# Patient Record
Sex: Female | Born: 1990 | Race: Black or African American | Hispanic: No | Marital: Single | Smoking: Former smoker
Health system: Southern US, Community
[De-identification: ages and names within clinical notes are randomized; demographics above are authoritative.]

## PROBLEM LIST (undated history)

## (undated) DIAGNOSIS — R51 Headache: Secondary | ICD-10-CM

## (undated) DIAGNOSIS — R519 Headache, unspecified: Secondary | ICD-10-CM

## (undated) HISTORY — PX: DILATION AND CURETTAGE OF UTERUS: SHX78

## (undated) HISTORY — PX: INDUCED ABORTION: SHX677

---
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
## 2011-02-27 LAB — POCT PREGNANCY, URINE
Operator id: 116391
Preg Test, Ur: NEGATIVE

## 2011-02-27 LAB — WET PREP, GENITAL
Trich, Wet Prep: NONE SEEN
Yeast Wet Prep HPF POC: NONE SEEN

## 2011-02-27 LAB — GC/CHLAMYDIA PROBE AMP, GENITAL
Chlamydia, DNA Probe: NEGATIVE
GC Probe Amp, Genital: NEGATIVE

## 2011-10-02 ENCOUNTER — Emergency Department (HOSPITAL_COMMUNITY)
Admission: EM | Admit: 2011-10-02 | Discharge: 2011-10-02 | Disposition: A | Discharge status: Home / Self Care | Payer: Self-pay | Attending: Emergency Medicine | Admitting: Emergency Medicine

## 2011-10-02 ENCOUNTER — Encounter (HOSPITAL_COMMUNITY): Payer: Self-pay

## 2011-10-02 DIAGNOSIS — B9689 Other specified bacterial agents as the cause of diseases classified elsewhere: Secondary | ICD-10-CM

## 2011-10-02 DIAGNOSIS — J45909 Unspecified asthma, uncomplicated: Secondary | ICD-10-CM | POA: Insufficient documentation

## 2011-10-02 DIAGNOSIS — N760 Acute vaginitis: Secondary | ICD-10-CM | POA: Insufficient documentation

## 2011-10-02 DIAGNOSIS — A599 Trichomoniasis, unspecified: Secondary | ICD-10-CM

## 2011-10-02 DIAGNOSIS — R109 Unspecified abdominal pain: Secondary | ICD-10-CM | POA: Insufficient documentation

## 2011-10-02 DIAGNOSIS — A499 Bacterial infection, unspecified: Secondary | ICD-10-CM | POA: Insufficient documentation

## 2011-10-02 DIAGNOSIS — R3915 Urgency of urination: Secondary | ICD-10-CM | POA: Insufficient documentation

## 2011-10-02 DIAGNOSIS — N949 Unspecified condition associated with female genital organs and menstrual cycle: Secondary | ICD-10-CM | POA: Insufficient documentation

## 2011-10-02 DIAGNOSIS — R300 Dysuria: Secondary | ICD-10-CM | POA: Insufficient documentation

## 2011-10-02 DIAGNOSIS — R350 Frequency of micturition: Secondary | ICD-10-CM | POA: Insufficient documentation

## 2011-10-02 LAB — URINALYSIS, ROUTINE W REFLEX MICROSCOPIC
Bilirubin Urine: NEGATIVE
Ketones, ur: NEGATIVE mg/dL
Nitrite: NEGATIVE
Specific Gravity, Urine: 1.025 (ref 1.005–1.030)
Urobilinogen, UA: 0.2 mg/dL (ref 0.0–1.0)
pH: 5.5 (ref 5.0–8.0)

## 2011-10-02 LAB — WET PREP, GENITAL: Yeast Wet Prep HPF POC: NONE SEEN

## 2011-10-02 LAB — URINE MICROSCOPIC-ADD ON

## 2011-10-02 MED ORDER — METRONIDAZOLE 500 MG PO TABS: 500.0000 mg | ORAL_TABLET | Freq: Two times a day (BID) | ORAL | Status: AC

## 2011-10-02 NOTE — ED Provider Notes (Signed)
Medical screening examination/treatment/procedure(s) were performed by non-physician practitioner and as supervising physician I was immediately available for consultation/collaboration.  Loren Racer, MD 10/02/11 [phone]

## 2011-10-02 NOTE — ED Provider Notes (Signed)
History     CSN: [id number]  Arrival date & time 10/02/11  4098   First MD Initiated Contact with Patient 10/02/11 [phone]      Chief Complaint  Patient presents with  . Urinary Tract Infection    (Consider location/radiation/quality/duration/timing/severity/associated sxs/prior treatment) Patient is a 21 y.o. female presenting with dysuria. The history is provided by the patient.  Dysuria  This is a new problem. Episode onset: today. The problem occurs every urination. The problem has been gradually worsening. The quality of the pain is described as burning. The pain is moderate. There has been no fever. She is sexually active. Associated symptoms include frequency and urgency. Pertinent negatives include no chills, no nausea, no vomiting, no discharge, no hematuria and no flank pain. Possible pregnancy: LMP 1 month ago, unsure. She has tried nothing for the symptoms.    Past Medical History  Diagnosis Date  . Asthma     History reviewed. No pertinent past surgical history.  No family history on file.  History  Substance Use Topics  . Smoking status: Current Everyday Smoker  . Smokeless tobacco: Not on file  . Alcohol Use: Yes     Review of Systems  Constitutional: Negative for fever and chills.  Gastrointestinal: Negative for nausea and vomiting. Abdominal pain: lower.  Genitourinary: Positive for dysuria, urgency and frequency. Negative for hematuria, flank pain, vaginal discharge and pelvic pain.  Musculoskeletal: Negative for back pain.    Allergies  Review of patient's allergies indicates no known allergies.  Home Medications   Current Outpatient Rx  Name Route Sig Dispense Refill  . IBUPROFEN 200 MG PO TABS Oral Take 400 mg by mouth every 6 (six) hours as needed. For pain      BP 115/82  Pulse 78  Temp 98.4 F (36.9 C)  SpO2 100%  LMP 09/03/2011  Physical Exam  Nursing note reviewed. Constitutional: She appears well-developed and well-nourished. No  distress.       Vital signs are reviewed and are normal.   HENT:  Head: Normocephalic and atraumatic.  Eyes: Conjunctivae are normal.  Neck: Neck supple.  Cardiovascular: Normal rate and regular rhythm.   Pulmonary/Chest: Effort normal. No respiratory distress.  Abdominal: Soft. Bowel sounds are normal. She exhibits no distension. There is Tenderness: mild, suprapubic.Marland Kitchen  Genitourinary: There is tenderness on the right labia. There is no rash or lesion on the right labia. There is tenderness on the left labia. There is no rash or lesion on the left labia. Uterus is not tender. Cervix exhibits no motion tenderness and no friability. Right adnexum displays no mass, no tenderness and no fullness. Left adnexum displays no mass, no tenderness and no fullness. No tenderness or bleeding around the vagina. No foreign body around the vagina. No signs of injury around the vagina. Vaginal discharge found.  Musculoskeletal: She exhibits no edema.  Neurological: She is alert.       MS appropriate for pt and situation  Skin: Skin is warm and dry.  Psychiatric: She has a normal mood and affect.    ED Course  Procedures (including critical care time)  Labs Reviewed  URINALYSIS, ROUTINE W REFLEX MICROSCOPIC - Abnormal; Notable for the following:    Leukocytes, UA SMALL (*)    All other components within normal limits  WET PREP, GENITAL - Abnormal; Notable for the following:    Trich, Wet Prep FEW (*)    Clue Cells Wet Prep HPF POC FEW (*)    WBC, Wet  Prep HPF POC FEW (*)    All other components within normal limits  PREGNANCY, URINE  URINE MICROSCOPIC-ADD ON  GC/CHLAMYDIA PROBE AMP, GENITAL   No results found.   Dx 1: Trichomonas Dx 2: Bacterial vaginosis   MDM  Dysuria x 1 day. U/a without clear evidence of UTI. Will culture. Pelvic exam with labial tenderness, no lesions. Wet prep + for trichomonas, b.v. Will tx with flagyl. Results and plan discussed with pt. Safe sex practices  discussed.        Shaaron Adler, PA-C 10/02/11 1204

## 2011-10-02 NOTE — Discharge Instructions (Signed)
Call the flow manager at the phone number above if you do not hear from the hospital in 3 days.     Free STD Testing These locations offer FREE, confidential testing for HIV, chlamydia, gonorrhea, and syphilis: [facility]: [address] [place]    [phone]    Mondays from 5pm-7pm [facility]: [address] [place], [address]    Self Help Building    [phone]    Wednesdays from 2pm-8pm [facility]: [address]    [phone]    Thursdays from 9am-12pm and 1pm-4pm [facility]: [address]    [phone]    Tuesdays from 9am-12pm, Thursdays from 1pm-4pm   [facility] offers FREE, confidential testing and treatment for HIV, chlamydia, gonorrhea, syphilis, herpes, bacterial vaginosis, yeast, and trichomoniasis:  Call [phone] for an appointment at either location, testing is Monday through Friday at both locations  [facility]: [address] [place] [facility]: [address]          Trichomoniasis Trichomoniasis is an infection, caused by the Trichomonas organism, that affects both women and men. In women, the outer female genitalia and the vagina are affected. In men, the penis is mainly affected, but the prostate and other reproductive organs can also be involved. Trichomoniasis is a sexually transmitted disease (STD) and is most often passed to another person through sexual contact. The majority of people who get trichomoniasis do so from a sexual encounter and are also at risk for other STDs. CAUSES   Sexual intercourse with an infected partner.   It can be present in swimming pools or hot tubs.  SYMPTOMS   Abnormal gray-green frothy vaginal discharge in women.   Vaginal itching and irritation in women.   Itching and irritation of the area outside the vagina in women.   Penile discharge with or without pain in males.   Inflammation of the  urethra (urethritis), causing painful urination.   Bleeding after sexual intercourse.  RELATED COMPLICATIONS  Pelvic inflammatory disease.   Infection of the uterus (endometritis).   Infertility.   Tubal (ectopic) pregnancy.   It can be associated with other STDs, including gonorrhea and chlamydia, hepatitis B, and HIV.  COMPLICATIONS DURING PREGNANCY  Early (premature) delivery.   Premature rupture of the membranes (PROM).   Low birth weight.  DIAGNOSIS   Visualization of Trichomonas under the microscope from the vagina discharge.   Ph of the vagina greater than 4.5, tested with a test tape.   Trich Rapid Test.   Culture of the organism, but this is not usually needed.   It may be found on a Pap test.   Having a "strawberry cervix,"which means the cervix looks very red like a strawberry.  TREATMENT   You may be given medication to fight the infection. Inform your caregiver if you could be or are pregnant. Some medications used to treat the infection should not be taken during pregnancy.   Over-the-counter medications or creams to decrease itching or irritation may be recommended.   Your sexual partner will need to be treated if infected.  HOME CARE INSTRUCTIONS   Take all medication prescribed by your caregiver.   Take over-the-counter medication for itching or irritation as directed by your caregiver.   Do not have sexual intercourse while you have the infection.   Do not douche or wear tampons.   Discuss your infection with  your partner, as your partner may have acquired the infection from you. Or, your partner may have been the person who transmitted the infection to you.   Have your sex partner examined and treated if necessary.   Practice safe, informed, and protected sex.   See your caregiver for other STD testing.  SEEK MEDICAL CARE IF:   You still have symptoms after you finish the medication.   You have an oral temperature above 102 F (38.9  C).   You develop belly (abdominal) pain.   You have pain when you urinate.   You have bleeding after sexual intercourse.   You develop a rash.   The medication makes you sick or makes you throw up (vomit).  Document Released: 11/01/2000 Document Revised: 04/27/2011 Document Reviewed: 11/27/2008 [facility] Patient Information 2012 [place], [place].         Bacterial Vaginosis Bacterial vaginosis (BV) is a vaginal infection where the normal balance of bacteria in the vagina is disrupted. The normal balance is then replaced by an overgrowth of certain bacteria. There are several different kinds of bacteria that can cause BV. BV is the most common vaginal infection in women of childbearing age. CAUSES   The cause of BV is not fully understood. BV develops when there is an increase or imbalance of harmful bacteria.   Some activities or behaviors can upset the normal balance of bacteria in the vagina and put women at increased risk including:   Having a new sex partner or multiple sex partners.   Douching.   Using an intrauterine device (IUD) for contraception.   It is not clear what role sexual activity plays in the development of BV. However, women that have never had sexual intercourse are rarely infected with BV.  Women do not get BV from toilet seats, bedding, swimming pools or from touching objects around them.  SYMPTOMS   Grey vaginal discharge.   A fish-like odor with discharge, especially after sexual intercourse.   Itching or burning of the vagina and vulva.   Burning or pain with urination.   Some women have no signs or symptoms at all.  DIAGNOSIS  Your caregiver must examine the vagina for signs of BV. Your caregiver will perform lab tests and look at the sample of vaginal fluid through a microscope. They will look for bacteria and abnormal cells (clue cells), a pH test higher than 4.5, and a positive amine test all associated with BV.  RISKS AND COMPLICATIONS     Pelvic inflammatory disease (PID).   Infections following gynecology surgery.   Developing HIV.   Developing herpes virus.  TREATMENT  Sometimes BV will clear up without treatment. However, all women with symptoms of BV should be treated to avoid complications, especially if gynecology surgery is planned. Female partners generally do not need to be treated. However, BV may spread between female sex partners so treatment is helpful in preventing a recurrence of BV.   BV may be treated with antibiotics. The antibiotics come in either pill or vaginal cream forms. Either can be used with nonpregnant or pregnant women, but the recommended dosages differ. These antibiotics are not harmful to the baby.   BV can recur after treatment. If this happens, a second round of antibiotics will often be prescribed.   Treatment is important for pregnant women. If not treated, BV can cause a premature delivery, especially for a pregnant woman who had a premature birth in the past. All pregnant women who have symptoms of  BV should be checked and treated.   For chronic reoccurrence of BV, treatment with a type of prescribed gel vaginally twice a week is helpful.  HOME CARE INSTRUCTIONS   Finish all medication as directed by your caregiver.   Do not have sex until treatment is completed.   Tell your sexual partner that you have a vaginal infection. They should see their caregiver and be treated if they have problems, such as a mild rash or itching.   Practice safe sex. Use condoms. Only have 1 sex partner.  PREVENTION  Basic prevention steps can help reduce the risk of upsetting the natural balance of bacteria in the vagina and developing BV:  Do not have sexual intercourse (be abstinent).   Do not douche.   Use all of the medicine prescribed for treatment of BV, even if the signs and symptoms go away.   Tell your sex partner if you have BV. That way, they can be treated, if needed, to prevent  reoccurrence.  SEEK MEDICAL CARE IF:   Your symptoms are not improving after 3 days of treatment.   You have increased discharge, pain, or fever.  MAKE SURE YOU:   Understand these instructions.   Will watch your condition.   Will get help right away if you are not doing well or get worse.  FOR MORE INFORMATION  Division of STD Prevention (DSTDP), Centers for Disease Control and Prevention: [employer] American Social Health Association (ASHA): www.ashastd.org  Document Released: 05/08/2005 Document Revised: 04/27/2011 Document Reviewed: 10/29/2008 [facility] Patient Information 2012 [place], [place].

## 2011-10-02 NOTE — ED Notes (Signed)
Pt woke this morning with symptoms of burning/painful urination.

## 2011-10-03 LAB — GC/CHLAMYDIA PROBE AMP, GENITAL
Chlamydia, DNA Probe: NEGATIVE
GC Probe Amp, Genital: NEGATIVE

## 2011-10-03 LAB — URINE CULTURE

## 2012-12-18 ENCOUNTER — Encounter (HOSPITAL_COMMUNITY): Payer: Self-pay | Admitting: Physical Medicine and Rehabilitation

## 2012-12-18 ENCOUNTER — Emergency Department (HOSPITAL_COMMUNITY)
Admission: EM | Admit: 2012-12-18 | Discharge: 2012-12-18 | Disposition: A | Discharge status: Home / Self Care | Payer: Self-pay | Attending: Emergency Medicine | Admitting: Emergency Medicine

## 2012-12-18 DIAGNOSIS — J029 Acute pharyngitis, unspecified: Secondary | ICD-10-CM | POA: Insufficient documentation

## 2012-12-18 DIAGNOSIS — IMO0001 Reserved for inherently not codable concepts without codable children: Secondary | ICD-10-CM | POA: Insufficient documentation

## 2012-12-18 DIAGNOSIS — J45909 Unspecified asthma, uncomplicated: Secondary | ICD-10-CM | POA: Insufficient documentation

## 2012-12-18 DIAGNOSIS — R062 Wheezing: Secondary | ICD-10-CM | POA: Insufficient documentation

## 2012-12-18 DIAGNOSIS — R51 Headache: Secondary | ICD-10-CM | POA: Insufficient documentation

## 2012-12-18 DIAGNOSIS — F17200 Nicotine dependence, unspecified, uncomplicated: Secondary | ICD-10-CM | POA: Insufficient documentation

## 2012-12-18 MED ORDER — IBUPROFEN 800 MG PO TABS: 800.0000 mg | ORAL_TABLET | Freq: Three times a day (TID) | ORAL | Status: DC

## 2012-12-18 MED ORDER — HYDROCODONE-ACETAMINOPHEN 5-325 MG PO TABS: 2.0000 | ORAL_TABLET | ORAL | Status: DC | PRN

## 2012-12-18 MED ORDER — CEPHALEXIN 500 MG PO CAPS: 500.0000 mg | ORAL_CAPSULE | Freq: Four times a day (QID) | ORAL | Status: DC

## 2012-12-18 NOTE — ED Provider Notes (Signed)
CSN: [id number]     Arrival date & time 12/18/12  6578 History     First MD Initiated Contact with Patient 12/18/12 [phone]     Chief Complaint  Patient presents with  . Generalized Body Aches  . Sore Throat   (Consider location/radiation/quality/duration/timing/severity/associated sxs/prior Treatment) Patient is a 22 y.o. female presenting with pharyngitis. The history is provided by the patient.  Sore Throat The current episode started yesterday. The problem occurs constantly. The problem has been gradually worsening. Associated symptoms include headaches, myalgias and a sore throat. Pertinent negatives include no abdominal pain, arthralgias, chest pain, congestion, coughing, fever, neck pain or vomiting. The symptoms are aggravated by swallowing. She has tried NSAIDs for the symptoms. The treatment provided no relief.    Past Medical History  Diagnosis Date  . Asthma    No past surgical history on file. No family history on file. History  Substance Use Topics  . Smoking status: Current Every Day Smoker    Types: Cigarettes  . Smokeless tobacco: Not on file  . Alcohol Use: Yes   OB History   Grav Para Term Preterm Abortions TAB SAB Ect Mult Living                 Review of Systems  Constitutional: Negative for fever and activity change.       All ROS Neg except as noted in HPI  HENT: Positive for sore throat. Negative for nosebleeds, congestion and neck pain.   Eyes: Negative for photophobia and discharge.  Respiratory: Positive for wheezing. Negative for cough and shortness of breath.   Cardiovascular: Negative for chest pain and palpitations.  Gastrointestinal: Negative for vomiting, abdominal pain and blood in stool.  Genitourinary: Negative for dysuria, frequency and hematuria.  Musculoskeletal: Positive for myalgias. Negative for back pain and arthralgias.  Skin: Negative.   Neurological: Positive for headaches. Negative for dizziness, seizures and speech difficulty.   Psychiatric/Behavioral: Negative for hallucinations and confusion.    Allergies  Review of patient's allergies indicates no known allergies.  Home Medications   Current Outpatient Rx  Name  Route  Sig  Dispense  Refill  . ibuprofen (ADVIL,MOTRIN) 200 MG tablet   Oral   Take 400 mg by mouth every 6 (six) hours as needed. For pain          BP 107/74  Pulse 108  Temp(Src) 99 F (37.2 C) (Oral)  Resp 18  SpO2 100% Physical Exam  Nursing note and vitals reviewed. Constitutional: She is oriented to person, place, and time. She appears well-developed and well-nourished.  Non-toxic appearance.  HENT:  Head: Normocephalic.  Right Ear: Tympanic membrane and external ear normal.  Left Ear: Tympanic membrane and external ear normal.  Mouth/Throat: Uvula is midline. Posterior oropharyngeal erythema present.  Eyes: EOM and lids are normal. Pupils are equal, round, and reactive to light.  Neck: Trachea normal and normal range of motion. Neck supple. Carotid bruit is not present.  Few palpable lymph nodes present.  Cardiovascular: Normal rate, regular rhythm, normal heart sounds, intact distal pulses and normal pulses.   Pulmonary/Chest: Breath sounds normal. No respiratory distress.  Abdominal: Soft. Bowel sounds are normal. There is no tenderness. There is no guarding.  Musculoskeletal: Normal range of motion.  Lymphadenopathy:       Head (right side): No submandibular adenopathy present.       Head (left side): No submandibular adenopathy present.    She has no cervical adenopathy.  Neurological: She is alert  and oriented to person, place, and time. She has normal strength. No cranial nerve deficit or sensory deficit.  Skin: Skin is warm and dry.  Psychiatric: She has a normal mood and affect. Her speech is normal.    ED Course   Procedures (including critical care time)  Labs Reviewed - No data to display No results found. No diagnosis found.  MDM   Pt presents with one  day of body aches, sore throat and geneerally not feeling well. Temp 99. Pulse elevated at 108. Pulse ox 100% on room air. WNL by my interpretation.Pt speaks in complete sentences. No distress. Plan: Rx for keflex and norco given to the patient. Pt to use salt water gargles and ibuprofen q6h. Pt to return if any changes or problems.I have reviewed nursing notes, vital signs, and all appropriate lab and imaging results for this patient.  Kathie Dike, PA-C 12/23/12 [phone]

## 2012-12-18 NOTE — ED Notes (Signed)
Pt presents to department for evaluation of body aches and sore throat. Ongoing x1 day. Denies any other complaints. Pt is alert and oriented x4. No fever.

## 2012-12-25 NOTE — ED Provider Notes (Addendum)
Medical screening examination/treatment/procedure(s) were performed by non-physician practitioner and as supervising physician I was immediately available for consultation/collaboration.  Enid Skeens, MD 12/25/12 1610  Enid Skeens, MD 01/09/13 2138

## 2013-02-03 ENCOUNTER — Encounter (HOSPITAL_COMMUNITY): Payer: Self-pay

## 2013-02-03 ENCOUNTER — Inpatient Hospital Stay (HOSPITAL_COMMUNITY)
Admission: AD | Admit: 2013-02-03 | Discharge: 2013-02-03 | Disposition: A | Discharge status: Home / Self Care | Payer: Self-pay | Source: Ambulatory Visit | Attending: Obstetrics and Gynecology | Admitting: Obstetrics and Gynecology

## 2013-02-03 DIAGNOSIS — R112 Nausea with vomiting, unspecified: Secondary | ICD-10-CM | POA: Insufficient documentation

## 2013-02-03 LAB — URINALYSIS, ROUTINE W REFLEX MICROSCOPIC
Glucose, UA: NEGATIVE mg/dL
Leukocytes, UA: NEGATIVE
Nitrite: NEGATIVE
Specific Gravity, Urine: 1.025 (ref 1.005–1.030)
pH: 7.5 (ref 5.0–8.0)

## 2013-02-03 LAB — URINE MICROSCOPIC-ADD ON

## 2013-02-03 LAB — POCT PREGNANCY, URINE: Preg Test, Ur: NEGATIVE

## 2013-02-03 MED ORDER — ONDANSETRON HCL 4 MG PO TABS: 4.0000 mg | ORAL_TABLET | Freq: Three times a day (TID) | ORAL | Status: DC | PRN

## 2013-02-03 MED ORDER — ONDANSETRON 4 MG PO TBDP
4.0000 mg | ORAL_TABLET | Freq: Once | ORAL | Status: AC
  Administered 2013-02-03: 4 mg via ORAL
  Filled 2013-02-03: qty 1

## 2013-02-03 NOTE — MAU Note (Signed)
Patient states she had vomiting from 0300 till 0700, none since. States soreness in upper abdomen from vomiting.

## 2013-02-03 NOTE — MAU Note (Signed)
Pt still bleeding slightly from menses, denies abnormal vaginal discharge. Does not have anything at home for n/v. Began getting sick at 0300 this am. Loose stools.

## 2013-02-03 NOTE — MAU Provider Note (Signed)
History     CSN: [id number]  Arrival date and time: 02/03/13 4098   First Provider Initiated Contact with Patient 02/03/13 1154      Chief Complaint  Patient presents with  . Emesis   HPI Ms. April Fischer is a 22 y.o. non-pregnant female who presents with complaints of N/V. Vomiting started this morning around 0300; she had 5 episodes of vomiting between 0500 and 0700. Pt has not vomited since 0700; she feels like it was related to something she ate. Denies heartburn, fever or vomiting currently, does feel nausea at this time. Feels hungry and would like to eat.     OB History   Grav Para Term Preterm Abortions TAB SAB Ect Mult Living   2    2 2           Past Medical History  Diagnosis Date  . Asthma     Past Surgical History  Procedure Laterality Date  . Dilation and curettage of uterus      History reviewed. No pertinent family history.  History  Substance Use Topics  . Smoking status: Current Every Day Smoker    Types: Cigars  . Smokeless tobacco: Never Used  . Alcohol Use: Yes    Allergies: No Known Allergies  Prescriptions prior to admission  Medication Sig Dispense Refill  . ibuprofen (ADVIL,MOTRIN) 800 MG tablet Take 1 tablet (800 mg total) by mouth 3 (three) times daily.  21 tablet  0   Results for orders placed during the hospital encounter of 02/03/13 (from the past 24 hour(s))  URINALYSIS, ROUTINE W REFLEX MICROSCOPIC     Status: Abnormal   Collection Time    02/03/13  9:30 AM      Result Value Range   Color, Urine YELLOW  YELLOW   APPearance CLEAR  CLEAR   Specific Gravity, Urine 1.025  1.005 - 1.030   pH 7.5  5.0 - 8.0   Glucose, UA NEGATIVE  NEGATIVE mg/dL   Hgb urine dipstick MODERATE (*) NEGATIVE   Bilirubin Urine NEGATIVE  NEGATIVE   Ketones, ur NEGATIVE  NEGATIVE mg/dL   Protein, ur NEGATIVE  NEGATIVE mg/dL   Urobilinogen, UA 0.2  0.0 - 1.0 mg/dL   Nitrite NEGATIVE  NEGATIVE   Leukocytes, UA NEGATIVE  NEGATIVE  URINE  MICROSCOPIC-ADD ON     Status: None   Collection Time    02/03/13  9:30 AM      Result Value Range   Squamous Epithelial / LPF RARE  RARE   WBC, UA 0-2  <3 WBC/hpf   RBC / HPF 0-2  <3 RBC/hpf  POCT PREGNANCY, URINE     Status: None   Collection Time    02/03/13  9:51 AM      Result Value Range   Preg Test, Ur NEGATIVE  NEGATIVE    Review of Systems  Constitutional: Negative for fever and chills.  HENT:       Slight HA  Respiratory: Negative for cough.   Gastrointestinal: Positive for nausea and vomiting. Negative for heartburn, abdominal pain, diarrhea and constipation.  Genitourinary: Negative for dysuria, urgency and frequency.       No vaginal discharge. + vaginal bleeding; on menses  No dysuria.   Musculoskeletal: Negative for back pain and joint pain.  Neurological: Positive for headaches. Negative for dizziness.   Physical Exam   Blood pressure 112/66, pulse 64, temperature 99 F (37.2 C), temperature source Oral, resp. rate 16, height 5\' 4"  (1.626 m), weight  74.934 kg (165 lb 3.2 oz), last menstrual period 01/30/2013, SpO2 100.00%.  Physical Exam  Constitutional: She is oriented to person, place, and time. She appears well-developed and well-nourished. No distress.  Neck: Neck supple.  Respiratory: Effort normal.  GI: Soft. She exhibits no distension. There is no tenderness.  Neurological: She is alert and oriented to person, place, and time.  Skin: Skin is warm. She is not diaphoretic.    MAU Course  Procedures  MDM Zofran ODT 4 mg in MAU Offer PO fluids if able to keep them down with discharge pt home with Zofran RX Pt kept down fluids in MAU with no vomiting   Assessment and Plan  A: Nausea and vomiting likely related to something she ate    P:  Discharge home Return to MAU if symptoms worsen RX: Zofran 4 mg PO Q8 hours as needed for nausea (#10) no rf  Discussed BRAT diet   RASCH, JENNIFER IRENE FNP-C 02/03/2013, 12:42 PM

## 2013-02-04 NOTE — MAU Provider Note (Signed)
Attestation of Attending Supervision of Advanced Practitioner (CNM/NP): Evaluation and management procedures were performed by the Advanced Practitioner under my supervision and collaboration.  I have reviewed the Advanced Practitioner's note and chart, and I agree with the management and plan.  Camelia Stelzner 02/04/2013 7:27 AM

## 2013-07-05 ENCOUNTER — Emergency Department (HOSPITAL_COMMUNITY)
Admission: EM | Admit: 2013-07-05 | Discharge: 2013-07-05 | Disposition: A | Discharge status: Home / Self Care | Payer: Self-pay | Attending: Emergency Medicine | Admitting: Emergency Medicine

## 2013-07-05 DIAGNOSIS — Z3202 Encounter for pregnancy test, result negative: Secondary | ICD-10-CM | POA: Insufficient documentation

## 2013-07-05 DIAGNOSIS — Z791 Long term (current) use of non-steroidal anti-inflammatories (NSAID): Secondary | ICD-10-CM | POA: Insufficient documentation

## 2013-07-05 DIAGNOSIS — R112 Nausea with vomiting, unspecified: Secondary | ICD-10-CM | POA: Insufficient documentation

## 2013-07-05 DIAGNOSIS — R109 Unspecified abdominal pain: Secondary | ICD-10-CM | POA: Insufficient documentation

## 2013-07-05 DIAGNOSIS — R1110 Vomiting, unspecified: Secondary | ICD-10-CM

## 2013-07-05 DIAGNOSIS — R197 Diarrhea, unspecified: Secondary | ICD-10-CM | POA: Insufficient documentation

## 2013-07-05 DIAGNOSIS — F17200 Nicotine dependence, unspecified, uncomplicated: Secondary | ICD-10-CM | POA: Insufficient documentation

## 2013-07-05 DIAGNOSIS — J45909 Unspecified asthma, uncomplicated: Secondary | ICD-10-CM | POA: Insufficient documentation

## 2013-07-05 LAB — URINALYSIS, ROUTINE W REFLEX MICROSCOPIC
Bilirubin Urine: NEGATIVE
GLUCOSE, UA: NEGATIVE mg/dL
Hgb urine dipstick: NEGATIVE
Ketones, ur: 15 mg/dL — AB
LEUKOCYTES UA: NEGATIVE
NITRITE: NEGATIVE
PH: 5.5 (ref 5.0–8.0)
Protein, ur: NEGATIVE mg/dL
SPECIFIC GRAVITY, URINE: 1.031 — AB (ref 1.005–1.030)
Urobilinogen, UA: 0.2 mg/dL (ref 0.0–1.0)

## 2013-07-05 LAB — POCT I-STAT, CHEM 8
BUN: 14 mg/dL (ref 6–23)
Calcium, Ion: 1.15 mmol/L (ref 1.12–1.23)
Chloride: 109 mEq/L (ref 96–112)
Creatinine, Ser: 0.6 mg/dL (ref 0.50–1.10)
GLUCOSE: 90 mg/dL (ref 70–99)
HCT: 46 % (ref 36.0–46.0)
Hemoglobin: 15.6 g/dL — ABNORMAL HIGH (ref 12.0–15.0)
POTASSIUM: 4 meq/L (ref 3.7–5.3)
SODIUM: 144 meq/L (ref 137–147)
TCO2: 20 mmol/L (ref 0–100)

## 2013-07-05 LAB — POCT PREGNANCY, URINE: Preg Test, Ur: NEGATIVE

## 2013-07-05 MED ORDER — SODIUM CHLORIDE 0.9 % IV BOLUS (SEPSIS)
1000.0000 mL | Freq: Once | INTRAVENOUS | Status: AC
  Administered 2013-07-05: 1000 mL via INTRAVENOUS

## 2013-07-05 MED ORDER — MORPHINE SULFATE 4 MG/ML IJ SOLN
4.0000 mg | Freq: Once | INTRAMUSCULAR | Status: AC
  Administered 2013-07-05: 4 mg via INTRAVENOUS
  Filled 2013-07-05: qty 1

## 2013-07-05 MED ORDER — OXYCODONE-ACETAMINOPHEN 5-325 MG PO TABS
1.0000 | ORAL_TABLET | Freq: Once | ORAL | Status: DC
  Filled 2013-07-05: qty 1

## 2013-07-05 MED ORDER — ONDANSETRON HCL 4 MG/2ML IJ SOLN
4.0000 mg | Freq: Once | INTRAMUSCULAR | Status: AC
  Administered 2013-07-05: 4 mg via INTRAVENOUS
  Filled 2013-07-05: qty 2

## 2013-07-05 MED ORDER — PROMETHAZINE HCL 25 MG/ML IJ SOLN
12.5000 mg | Freq: Once | INTRAMUSCULAR | Status: AC
  Administered 2013-07-05: 12.5 mg via INTRAVENOUS
  Filled 2013-07-05: qty 1

## 2013-07-05 MED ORDER — ONDANSETRON 4 MG PO TBDP: 4.0000 mg | ORAL_TABLET | Freq: Three times a day (TID) | ORAL | Status: DC | PRN

## 2013-07-05 NOTE — Discharge Instructions (Signed)
Nausea and Vomiting °Nausea means you feel sick to your stomach. Throwing up (vomiting) is a reflex where stomach contents come out of your mouth. °HOME CARE  °· Take medicine as told by your doctor. °· Do not force yourself to eat. However, you do need to drink fluids. °· If you feel like eating, eat a normal diet as told by your doctor. °· Eat rice, wheat, potatoes, bread, lean meats, yogurt, fruits, and vegetables. °· Avoid high-fat foods. °· Drink enough fluids to keep your pee (urine) clear or pale yellow. °· Ask your doctor how to replace body fluid losses (rehydrate). Signs of body fluid loss (dehydration) include: °· Feeling very thirsty. °· Dry lips and mouth. °· Feeling dizzy. °· Dark pee. °· Peeing less than normal. °· Feeling confused. °· Fast breathing or heart rate. °GET HELP RIGHT AWAY IF:  °· You have blood in your throw up. °· You have black or bloody poop (stool). °· You have a bad headache or stiff neck. °· You feel confused. °· You have bad belly (abdominal) pain. °· You have chest pain or trouble breathing. °· You do not pee at least once every 8 hours. °· You have cold, clammy skin. °· You keep throwing up after 24 to 48 hours. °· You have a fever. °MAKE SURE YOU:  °· Understand these instructions. °· Will watch your condition. °· Will get help right away if you are not doing well or get worse. °Document Released: 10/25/2007 Document Revised: 07/31/2011 Document Reviewed: 10/07/2010 °[employer]® Patient Information ©2014 [employer]. ° °

## 2013-07-05 NOTE — ED Provider Notes (Signed)
Medical screening examination/treatment/procedure(s) were performed by non-physician practitioner and as supervising physician I was immediately available for consultation/collaboration.  EKG Interpretation   None         Shon Batonourtney F Muad Noga, MD 07/05/13 1943

## 2013-07-05 NOTE — ED Notes (Signed)
Pt arrived EMS from home. Pt woke up with cramping abdominal pain. Pt complaining of nausea and diarrhea. Pt's LMP was end of January. Pt states she doesn't know if she is pregnant or not. Pt received 4 mg of Zofran which initially helped with the nausea but it has returned. Pt states her nephew is ill will flu like symptoms.  114/68 90-HR  from EMS

## 2013-07-05 NOTE — ED Provider Notes (Signed)
CSN: [id number]     Arrival date & time 07/05/13  1451 History   First MD Initiated Contact with Patient 07/05/13 1453     Chief Complaint  Patient presents with  . Abdominal Pain  . Nausea  . Diarrhea     (Consider location/radiation/quality/duration/timing/severity/associated sxs/prior Treatment) HPI Comments: Pt state that she started with vomiting, diarrhea and abdominal cramping this morning.no fever. Nephew has similar symptoms. Pt states she is unsure of last menstrual cycle. Tried to take pepto bismol but vomited it WU:[id number]:denies dysuria or discharge  The history is provided by the patient. No language interpreter was used.    Past Medical History  Diagnosis Date  . Asthma    Past Surgical History  Procedure Laterality Date  . Dilation and curettage of uterus     No family history on file. History  Substance Use Topics  . Smoking status: Current Every Day Smoker    Types: Cigars  . Smokeless tobacco: Never Used  . Alcohol Use: Yes   OB History   Grav Para Term Preterm Abortions TAB SAB Ect Mult Living   2    2 2          Review of Systems  Constitutional: Negative.   Respiratory: Negative.   Cardiovascular: Negative.       Allergies  Review of patient's allergies indicates no known allergies.  Home Medications   Current Outpatient Rx  Name  Route  Sig  Dispense  Refill  . ibuprofen (ADVIL,MOTRIN) 800 MG tablet   Oral   Take 1 tablet (800 mg total) by mouth 3 (three) times daily.   21 tablet   0   . ondansetron (ZOFRAN) 4 MG tablet   Oral   Take 1 tablet (4 mg total) by mouth every 8 (eight) hours as needed for nausea.   5 tablet   0    BP 111/75  Pulse 74  Temp(Src) 98.2 F (36.8 C) (Oral)  Resp 18  SpO2 100%  LMP 06/20/2013 Physical Exam  Nursing note and vitals reviewed. Constitutional: She is oriented to person, place, and time. She appears well-developed and well-nourished.  Cardiovascular: Normal rate and regular rhythm.    Pulmonary/Chest: Effort normal and breath sounds normal.  Abdominal: Soft. Bowel sounds are normal. There is no tenderness.  Musculoskeletal: Normal range of motion.  Neurological: She is alert and oriented to person, place, and time.  Skin: Skin is warm and dry.  Psychiatric: She has a normal mood and affect.    ED Course  Procedures (including critical care time) Labs Review Labs Reviewed  URINALYSIS, ROUTINE W REFLEX MICROSCOPIC - Abnormal; Notable for the following:    Specific Gravity, Urine 1.031 (*)    Ketones, ur 15 (*)    All other components within normal limits  POCT I-STAT, CHEM 8 - Abnormal; Notable for the following:    Hemoglobin 15.6 (*)    All other components within normal limits  POCT PREGNANCY, URINE   Imaging Review No results found.  EKG Interpretation   None       MDM   Final diagnoses:  Vomiting and diarrhea    Abdomen continues to be benign:pt is tolerating po without any problem. Will send home with zofran. Pt is tolerating po at this time    Teressa LowerVrinda Murry Diaz, NP 07/05/13 1826

## 2013-09-10 ENCOUNTER — Emergency Department (HOSPITAL_COMMUNITY)
Admission: EM | Admit: 2013-09-10 | Discharge: 2013-09-11 | Disposition: A | Discharge status: Home / Self Care | Payer: Self-pay | Attending: Emergency Medicine | Admitting: Emergency Medicine

## 2013-09-10 ENCOUNTER — Encounter (HOSPITAL_COMMUNITY): Payer: Self-pay | Admitting: Emergency Medicine

## 2013-09-10 DIAGNOSIS — N920 Excessive and frequent menstruation with regular cycle: Secondary | ICD-10-CM

## 2013-09-10 DIAGNOSIS — N760 Acute vaginitis: Secondary | ICD-10-CM

## 2013-09-10 DIAGNOSIS — A5901 Trichomonal vulvovaginitis: Secondary | ICD-10-CM

## 2013-09-10 DIAGNOSIS — R109 Unspecified abdominal pain: Secondary | ICD-10-CM

## 2013-09-10 DIAGNOSIS — Z79899 Other long term (current) drug therapy: Secondary | ICD-10-CM | POA: Insufficient documentation

## 2013-09-10 DIAGNOSIS — J45909 Unspecified asthma, uncomplicated: Secondary | ICD-10-CM | POA: Insufficient documentation

## 2013-09-10 DIAGNOSIS — F17200 Nicotine dependence, unspecified, uncomplicated: Secondary | ICD-10-CM | POA: Insufficient documentation

## 2013-09-10 DIAGNOSIS — B9689 Other specified bacterial agents as the cause of diseases classified elsewhere: Secondary | ICD-10-CM

## 2013-09-10 LAB — COMPREHENSIVE METABOLIC PANEL
ALBUMIN: 4 g/dL (ref 3.5–5.2)
ALK PHOS: 82 U/L (ref 39–117)
ALT: 11 U/L (ref 0–35)
AST: 18 U/L (ref 0–37)
BUN: 11 mg/dL (ref 6–23)
CHLORIDE: 104 meq/L (ref 96–112)
CO2: 25 mEq/L (ref 19–32)
CREATININE: 0.77 mg/dL (ref 0.50–1.10)
Calcium: 9.5 mg/dL (ref 8.4–10.5)
GFR calc non Af Amer: >90 mL/min (ref >90)
GLUCOSE: 91 mg/dL (ref 70–99)
POTASSIUM: 4.4 meq/L (ref 3.7–5.3)
Sodium: 142 mEq/L (ref 137–147)
Total Bilirubin: <0.2 mg/dL — ABNORMAL LOW (ref 0.3–1.2)
Total Protein: 7.4 g/dL (ref 6.0–8.3)

## 2013-09-10 LAB — CBC WITH DIFFERENTIAL/PLATELET
Basophils Absolute: 0 10*3/uL (ref 0.0–0.1)
Basophils Relative: 0 % (ref 0–1)
EOS ABS: 0.1 10*3/uL (ref 0.0–0.7)
Eosinophils Relative: 2 % (ref 0–5)
HCT: 43 % (ref 36.0–46.0)
HEMOGLOBIN: 14.1 g/dL (ref 12.0–15.0)
LYMPHS ABS: 3.4 10*3/uL (ref 0.7–4.0)
Lymphocytes Relative: 49 % — ABNORMAL HIGH (ref 12–46)
MCH: 28.8 pg (ref 26.0–34.0)
MCHC: 32.8 g/dL (ref 30.0–36.0)
MCV: 87.8 fL (ref 78.0–100.0)
MONO ABS: 0.3 10*3/uL (ref 0.1–1.0)
MONOS PCT: 5 % (ref 3–12)
NEUTROS PCT: 44 % (ref 43–77)
Neutro Abs: 3.1 10*3/uL (ref 1.7–7.7)
Platelets: 170 10*3/uL (ref 150–400)
RBC: 4.9 MIL/uL (ref 3.87–5.11)
RDW: 14.7 % (ref 11.5–15.5)
WBC: 6.9 10*3/uL (ref 4.0–10.5)

## 2013-09-10 LAB — URINALYSIS, ROUTINE W REFLEX MICROSCOPIC
BILIRUBIN URINE: NEGATIVE
Glucose, UA: NEGATIVE mg/dL
Ketones, ur: NEGATIVE mg/dL
NITRITE: NEGATIVE
PROTEIN: NEGATIVE mg/dL
Specific Gravity, Urine: 1.01 (ref 1.005–1.030)
UROBILINOGEN UA: 0.2 mg/dL (ref 0.0–1.0)
pH: 7 (ref 5.0–8.0)

## 2013-09-10 LAB — LIPASE, BLOOD: LIPASE: 18 U/L (ref 11–59)

## 2013-09-10 LAB — WET PREP, GENITAL: YEAST WET PREP: NONE SEEN

## 2013-09-10 LAB — URINE MICROSCOPIC-ADD ON

## 2013-09-10 LAB — POC URINE PREG, ED: PREG TEST UR: NEGATIVE

## 2013-09-10 MED ORDER — IBUPROFEN 800 MG PO TABS
800.0000 mg | ORAL_TABLET | Freq: Once | ORAL | Status: AC
  Administered 2013-09-10: 800 mg via ORAL
  Filled 2013-09-10: qty 1

## 2013-09-10 NOTE — ED Notes (Signed)
Pt presents to department for evaluation of lower abdominal pain and irregular menstrual period. States history of endometriosis. Now states increased pain and irregular period, states bleeding intermittent every week. Denies vaginal discharge. Denies urinary symptoms. Pt is alert and oriented x4.

## 2013-09-10 NOTE — ED Provider Notes (Signed)
CSN: [id number]     Arrival date & time 09/10/13  1803 History   First MD Initiated Contact with Patient 09/10/13 2136     Chief Complaint  Patient presents with  . Abdominal Pain     (Consider location/radiation/quality/duration/timing/severity/associated sxs/prior Treatment) HPI Comments: Patient is a 23 year old female with a PMHx of asthma and endometriosis who presents to the emergency department complaining of gradual onset lower abdominal pain beginning last night. Pain radiates across her lower abdomen, described as "just a pain" rated 4/10. She has not tried any alleviating factors. Nothing in specific makes her pain worse. She began her menstrual cycle yesterday but has had intermittent spotting recently. Denies vaginal discharge, vaginal pain, increased urinary frequency, urgency or dysuria, nausea, vomiting or diarrhea, weakness, lightheadedness or dizziness. Denies fever or chills.  Patient is a 23 y.o. female presenting with abdominal pain. The history is provided by the patient.  Abdominal Pain   Past Medical History  Diagnosis Date  . Asthma    Past Surgical History  Procedure Laterality Date  . Dilation and curettage of uterus     No family history on file. History  Substance Use Topics  . Smoking status: Current Every Day Smoker    Types: Cigars  . Smokeless tobacco: Never Used  . Alcohol Use: Yes   OB History   Grav Para Term Preterm Abortions TAB SAB Ect Mult Living   2    2 2          Review of Systems  Gastrointestinal: Positive for abdominal pain.  Genitourinary: Positive for menstrual problem.  All other systems reviewed and are negative.     Allergies  Review of patient's allergies indicates no known allergies.  Home Medications   Prior to Admission medications   Medication Sig Start Date End Date Taking? Authorizing Provider  naproxen sodium (ANAPROX) 220 MG tablet Take 220 mg by mouth daily as needed (pain).    Yes Historical Provider, MD    BP 111/63  Pulse 77  Temp(Src) 98.3 F (36.8 C) (Oral)  Resp 16  SpO2 100% Physical Exam  Nursing note and vitals reviewed. Constitutional: She is oriented to person, place, and time. She appears well-developed and well-nourished. No distress.  HENT:  Head: Normocephalic and atraumatic.  Mouth/Throat: Oropharynx is clear and moist.  Eyes: Conjunctivae are normal.  Neck: Normal range of motion. Neck supple.  Cardiovascular: Normal rate, regular rhythm and normal heart sounds.   Pulmonary/Chest: Effort normal and breath sounds normal.  Abdominal: Soft. Normal appearance and bowel sounds are normal. She exhibits no mass. There is tenderness in the suprapubic area. There is no rigidity, no rebound and no guarding.  No peritoneal signs.  Genitourinary: Uterus normal. Cervix exhibits no motion tenderness and no friability. Right adnexum displays no mass, no tenderness and no fullness. Left adnexum displays no mass, no tenderness and no fullness. There is bleeding (dark blood consistent with menstrual blood) around the vagina.  Suprapubic tenderness.  Musculoskeletal: Normal range of motion. She exhibits no edema.  Neurological: She is alert and oriented to person, place, and time.  Skin: Skin is warm and dry. She is not diaphoretic.  Psychiatric: She has a normal mood and affect. Her behavior is normal.    ED Course  Procedures (including critical care time) Labs Review Labs Reviewed  WET PREP, GENITAL - Abnormal; Notable for the following:    Trich, Wet Prep FEW (*)    Clue Cells Wet Prep HPF POC FEW (*)  WBC, Wet Prep HPF POC FEW (*)    All other components within normal limits  CBC WITH DIFFERENTIAL - Abnormal; Notable for the following:    Lymphocytes Relative 49 (*)    All other components within normal limits  COMPREHENSIVE METABOLIC PANEL - Abnormal; Notable for the following:    Total Bilirubin <0.2 (*)    All other components within normal limits  URINALYSIS, ROUTINE  W REFLEX MICROSCOPIC - Abnormal; Notable for the following:    Color, Urine RED (*)    APPearance CLOUDY (*)    Hgb urine dipstick LARGE (*)    Leukocytes, UA SMALL (*)    All other components within normal limits  GC/CHLAMYDIA PROBE AMP  LIPASE, BLOOD  URINE MICROSCOPIC-ADD ON  POC URINE PREG, ED    Imaging Review No results found.   EKG Interpretation None      MDM   Final diagnoses:  Trichomonas vaginitis  BV (bacterial vaginosis)  Abdominal pain  Menorrhagia   Patient presenting with abdominal pain and menorrhagia. History of endometriosis. She is well appearing and in no apparent distress, afebrile with normal vital signs. Lower abdominal tenderness present. No peritoneal signs. Pain gradual onset. Doubt ovarian torsion. She had suprapubic tenderness on pelvic exam, no adnexal tenderness or masses. No uterine masses palpated. No cervical motion tenderness. Wet prep positive for trichomoniasis and clue cells. Will treat with Flagyl. Discussed sexually transmitted diseases the patient. She is refusing treatment for GC/chlamydia as prophylaxis today. She would prefer to wait for the cultures. Doubt PID. Urine pregnancy negative. Labs obtained in triage prior to patient being seen, no acute findings. No urinary tract infection. Stable for discharge, followup with GYN. Resources given. Return precautions given. Patient states understanding of treatment care plan and is agreeable.    Trevor MaceRobyn M Albert, PA-C 09/11/13 [phone]

## 2013-09-11 LAB — GC/CHLAMYDIA PROBE AMP
CT PROBE, AMP APTIMA: NEGATIVE
GC Probe RNA: NEGATIVE

## 2013-09-11 MED ORDER — METRONIDAZOLE 500 MG PO TABS: 500.0000 mg | ORAL_TABLET | Freq: Two times a day (BID) | ORAL | Status: DC

## 2013-09-11 MED ORDER — IBUPROFEN 800 MG PO TABS: 800.0000 mg | ORAL_TABLET | Freq: Three times a day (TID) | ORAL | Status: DC

## 2013-09-11 NOTE — ED Provider Notes (Signed)
Medical screening examination/treatment/procedure(s) were performed by non-physician practitioner and as supervising physician I was immediately available for consultation/collaboration.   EKG Interpretation None        Chandel Zaun B. Nussen Pullin, MD 09/11/13 1542 

## 2013-09-11 NOTE — ED Notes (Signed)
Pt content with care, stable and understands instructions given

## 2013-09-11 NOTE — Discharge Instructions (Signed)
You have Trichomonas. This is a sexually transmitted disease and are obligated to inform your partner. Gonorrhea and Chlamydia cultures are pending, if these result positive you'll be informed and will need to return for treatment. Avoid sexual intercourse for 2 weeks.  Abdominal Pain, Women Abdominal (stomach, pelvic, or belly) pain can be caused by many things. It is important to tell your doctor:  The location of the pain.  Does it come and go or is it present all the time?  Are there things that start the pain (eating certain foods, exercise)?  Are there other symptoms associated with the pain (fever, nausea, vomiting, diarrhea)? All of this is helpful to know when trying to find the cause of the pain. CAUSES   Stomach: virus or bacteria infection, or ulcer.  Intestine: appendicitis (inflamed appendix), regional ileitis (Crohn's disease), ulcerative colitis (inflamed colon), irritable bowel syndrome, diverticulitis (inflamed diverticulum of the colon), or cancer of the stomach or intestine.  Gallbladder disease or stones in the gallbladder.  Kidney disease, kidney stones, or infection.  Pancreas infection or cancer.  Fibromyalgia (pain disorder).  Diseases of the female organs:  Uterus: fibroid (non-cancerous) tumors or infection.  Fallopian tubes: infection or tubal pregnancy.  Ovary: cysts or tumors.  Pelvic adhesions (scar tissue).  Endometriosis (uterus lining tissue growing in the pelvis and on the pelvic organs).  Pelvic congestion syndrome (female organs filling up with blood just before the menstrual period).  Pain with the menstrual period.  Pain with ovulation (producing an egg).  Pain with an IUD (intrauterine device, birth control) in the uterus.  Cancer of the female organs.  Functional pain (pain not caused by a disease, may improve without treatment).  Psychological pain.  Depression. DIAGNOSIS  Your doctor will decide the seriousness of your  pain by doing an examination.  Blood tests.  X-rays.  Ultrasound.  CT scan (computed tomography, special type of X-ray).  MRI (magnetic resonance imaging).  Cultures, for infection.  Barium enema (dye inserted in the large intestine, to better view it with X-rays).  Colonoscopy (looking in intestine with a lighted tube).  Laparoscopy (minor surgery, looking in abdomen with a lighted tube).  Major abdominal exploratory surgery (looking in abdomen with a large incision). TREATMENT  The treatment will depend on the cause of the pain.   Many cases can be observed and treated at home.  Over-the-counter medicines recommended by your caregiver.  Prescription medicine.  Antibiotics, for infection.  Birth control pills, for painful periods or for ovulation pain.  Hormone treatment, for endometriosis.  Nerve blocking injections.  Physical therapy.  Antidepressants.  Counseling with a psychologist or psychiatrist.  Minor or major surgery. HOME CARE INSTRUCTIONS   Do not take laxatives, unless directed by your caregiver.  Take over-the-counter pain medicine only if ordered by your caregiver. Do not take aspirin because it can cause an upset stomach or bleeding.  Try a clear liquid diet (broth or water) as ordered by your caregiver. Slowly move to a bland diet, as tolerated, if the pain is related to the stomach or intestine.  Have a thermometer and take your temperature several times a day, and record it.  Bed rest and sleep, if it helps the pain.  Avoid sexual intercourse, if it causes pain.  Avoid stressful situations.  Keep your follow-up appointments and tests, as your caregiver orders.  If the pain does not go away with medicine or surgery, you may try:  Acupuncture.  Relaxation exercises (yoga, meditation).  Group  therapy.  Counseling. SEEK MEDICAL CARE IF:   You notice certain foods cause stomach pain.  Your home care treatment is not helping  your pain.  You need stronger pain medicine.  You want your IUD removed.  You feel faint or lightheaded.  You develop nausea and vomiting.  You develop a rash.  You are having side effects or an allergy to your medicine. SEEK IMMEDIATE MEDICAL CARE IF:   Your pain does not go away or gets worse.  You have a fever.  Your pain is felt only in portions of the abdomen. The right side could possibly be appendicitis. The left lower portion of the abdomen could be colitis or diverticulitis.  You are passing blood in your stools (bright red or black tarry stools, with or without vomiting).  You have blood in your urine.  You develop chills, with or without a fever.  You pass out. MAKE SURE YOU:   Understand these instructions.  Will watch your condition.  Will get help right away if you are not doing well or get worse. Document Released: 03/05/2007 Document Revised: 07/31/2011 Document Reviewed: 03/25/2009 [facility] Patient Information 2014 [place], [place].  Trichomoniasis Trichomoniasis is an infection, caused by the Trichomonas organism, that affects both women and men. In women, the outer female genitalia and the vagina are affected. In men, the penis is mainly affected, but the prostate and other reproductive organs can also be involved. Trichomoniasis is a sexually transmitted disease (STD) and is most often passed to another person through sexual contact. The majority of people who get trichomoniasis do so from a sexual encounter and are also at risk for other STDs. CAUSES   Sexual intercourse with an infected partner.  It can be present in swimming pools or hot tubs. SYMPTOMS   Abnormal gray-green frothy vaginal discharge in women.  Vaginal itching and irritation in women.  Itching and irritation of the area outside the vagina in women.  Penile discharge with or without pain in males.  Inflammation of the urethra (urethritis), causing painful  urination.  Bleeding after sexual intercourse. RELATED COMPLICATIONS  Pelvic inflammatory disease.  Infection of the uterus (endometritis).  Infertility.  Tubal (ectopic) pregnancy.  It can be associated with other STDs, including gonorrhea and chlamydia, hepatitis B, and HIV. COMPLICATIONS DURING PREGNANCY  Early (premature) delivery.  Premature rupture of the membranes (PROM).  Low birth weight. DIAGNOSIS   Visualization of Trichomonas under the microscope from the vagina discharge.  Ph of the vagina greater than 4.5, tested with a test tape.  Trich Rapid Test.  Culture of the organism, but this is not usually needed.  It may be found on a Pap test.  Having a "strawberry cervix,"which means the cervix looks very red like a strawberry. TREATMENT   You may be given medication to fight the infection. Inform your caregiver if you could be or are pregnant. Some medications used to treat the infection should not be taken during pregnancy.  Over-the-counter medications or creams to decrease itching or irritation may be recommended.  Your sexual partner will need to be treated if infected. HOME CARE INSTRUCTIONS   Take all medication prescribed by your caregiver.  Take over-the-counter medication for itching or irritation as directed by your caregiver.  Do not have sexual intercourse while you have the infection.  Do not douche or wear tampons.  Discuss your infection with your partner, as your partner may have acquired the infection from you. Or, your partner may have been the  person who transmitted the infection to you.  Have your sex partner examined and treated if necessary.  Practice safe, informed, and protected sex.  See your caregiver for other STD testing. SEEK MEDICAL CARE IF:   You still have symptoms after you finish the medication.  You have an oral temperature above 102 F (38.9 C).  You develop belly (abdominal) pain.  You have pain when you  urinate.  You have bleeding after sexual intercourse.  You develop a rash.  The medication makes you sick or makes you throw up (vomit). Document Released: 11/01/2000 Document Revised: 07/31/2011 Document Reviewed: 11/27/2008 [facility] Patient Information 2014 [place], [place].  Bacterial Vaginosis Bacterial vaginosis is a vaginal infection that occurs when the normal balance of bacteria in the vagina is disrupted. It results from an overgrowth of certain bacteria. This is the most common vaginal infection in women of childbearing age. Treatment is important to prevent complications, especially in pregnant women, as it can cause a premature delivery. CAUSES  Bacterial vaginosis is caused by an increase in harmful bacteria that are normally present in smaller amounts in the vagina. Several different kinds of bacteria can cause bacterial vaginosis. However, the reason that the condition develops is not fully understood. RISK FACTORS Certain activities or behaviors can put you at an increased risk of developing bacterial vaginosis, including:  Having a new sex partner or multiple sex partners.  Douching.  Using an intrauterine device (IUD) for contraception. Women do not get bacterial vaginosis from toilet seats, bedding, swimming pools, or contact with objects around them. SIGNS AND SYMPTOMS  Some women with bacterial vaginosis have no signs or symptoms. Common symptoms include:  Grey vaginal discharge.  A fishlike odor with discharge, especially after sexual intercourse.  Itching or burning of the vagina and vulva.  Burning or pain with urination. DIAGNOSIS  Your health care provider will take a medical history and examine the vagina for signs of bacterial vaginosis. A sample of vaginal fluid may be taken. Your health care provider will look at this sample under a microscope to check for bacteria and abnormal cells. A vaginal pH test may also be done.  TREATMENT  Bacterial vaginosis  may be treated with antibiotic medicines. These may be given in the form of a pill or a vaginal cream. A second round of antibiotics may be prescribed if the condition comes back after treatment.  HOME CARE INSTRUCTIONS   Only take over-the-counter or prescription medicines as directed by your health care provider.  If antibiotic medicine was prescribed, take it as directed. Make sure you finish it even if you start to feel better.  Do not have sex until treatment is completed.  Tell all sexual partners that you have a vaginal infection. They should see their health care provider and be treated if they have problems, such as a mild rash or itching.  Practice safe sex by using condoms and only having one sex partner. SEEK MEDICAL CARE IF:   Your symptoms are not improving after 3 days of treatment.  You have increased discharge or pain.  You have a fever. MAKE SURE YOU:   Understand these instructions.  Will watch your condition.  Will get help right away if you are not doing well or get worse. FOR MORE INFORMATION  Centers for Disease Control and Prevention, Division of STD Prevention: [employer] American Sexual Health Association (ASHA): www.ashastd.org  Document Released: 05/08/2005 Document Revised: 02/26/2013 Document Reviewed: 12/18/2012 [facility] Patient Information 2014 [place], [place].

## 2013-10-20 ENCOUNTER — Emergency Department (HOSPITAL_COMMUNITY): Admission: EM | Admit: 2013-10-20 | Discharge: 2013-10-20 | Discharge status: Absent without leave | Payer: Self-pay

## 2014-03-23 ENCOUNTER — Encounter (HOSPITAL_COMMUNITY): Payer: Self-pay | Admitting: Emergency Medicine

## 2014-08-05 ENCOUNTER — Inpatient Hospital Stay (HOSPITAL_COMMUNITY)
Admission: AD | Admit: 2014-08-05 | Discharge: 2014-08-05 | Disposition: A | Discharge status: Home / Self Care | Payer: Self-pay | Source: Ambulatory Visit | Attending: Obstetrics & Gynecology | Admitting: Obstetrics & Gynecology

## 2014-08-05 ENCOUNTER — Encounter (HOSPITAL_COMMUNITY): Payer: Self-pay | Admitting: *Deleted

## 2014-08-05 DIAGNOSIS — F17210 Nicotine dependence, cigarettes, uncomplicated: Secondary | ICD-10-CM | POA: Insufficient documentation

## 2014-08-05 DIAGNOSIS — A599 Trichomoniasis, unspecified: Secondary | ICD-10-CM

## 2014-08-05 DIAGNOSIS — A5901 Trichomonal vulvovaginitis: Secondary | ICD-10-CM | POA: Insufficient documentation

## 2014-08-05 DIAGNOSIS — B3731 Acute candidiasis of vulva and vagina: Secondary | ICD-10-CM

## 2014-08-05 DIAGNOSIS — B373 Candidiasis of vulva and vagina: Secondary | ICD-10-CM | POA: Insufficient documentation

## 2014-08-05 HISTORY — DX: Headache, unspecified: R51.9

## 2014-08-05 HISTORY — DX: Headache: R51

## 2014-08-05 LAB — URINALYSIS, ROUTINE W REFLEX MICROSCOPIC
Bilirubin Urine: NEGATIVE
Glucose, UA: NEGATIVE mg/dL
Ketones, ur: NEGATIVE mg/dL
Nitrite: NEGATIVE
PROTEIN: NEGATIVE mg/dL
Specific Gravity, Urine: 1.025 (ref 1.005–1.030)
Urobilinogen, UA: 0.2 mg/dL (ref 0.0–1.0)
pH: 5.5 (ref 5.0–8.0)

## 2014-08-05 LAB — URINE MICROSCOPIC-ADD ON

## 2014-08-05 LAB — POCT PREGNANCY, URINE: PREG TEST UR: NEGATIVE

## 2014-08-05 LAB — WET PREP, GENITAL: Clue Cells Wet Prep HPF POC: NONE SEEN

## 2014-08-05 MED ORDER — METRONIDAZOLE 500 MG PO TABS
2000.0000 mg | ORAL_TABLET | Freq: Once | ORAL | Status: AC
  Administered 2014-08-05: 2000 mg via ORAL
  Filled 2014-08-05: qty 4

## 2014-08-05 MED ORDER — FLUCONAZOLE 150 MG PO TABS: 150.0000 mg | ORAL_TABLET | Freq: Every day | ORAL | Status: DC

## 2014-08-05 NOTE — Discharge Instructions (Signed)
Candida Infection A Candida infection (also called yeast, fungus, and Monilia infection) is an overgrowth of yeast that can occur anywhere on the body. A yeast infection commonly occurs in warm, moist body areas. Usually, the infection remains localized but can spread to become a systemic infection. A yeast infection may be a sign of a more severe disease such as diabetes, leukemia, or AIDS. A yeast infection can occur in both men and women. In women, Candida vaginitis is a vaginal infection. It is one of the most common causes of vaginitis. Men usually do not have symptoms or know they have an infection until other problems develop. Men may find out they have a yeast infection because their sex partner has a yeast infection. Uncircumcised men are more likely to get a yeast infection than circumcised men. This is because the uncircumcised glans is not exposed to air and does not remain as dry as that of a circumcised glans. Older adults may develop yeast infections around dentures. CAUSES  Women  Antibiotics.  Steroid medication taken for a long time.  Being overweight (obese).  Diabetes.  Poor immune condition.  Certain serious medical conditions.  Immune suppressive medications for organ transplant patients.  Chemotherapy.  Pregnancy.  Menstruation.  Stress and fatigue.  Intravenous drug use.  Oral contraceptives.  Wearing tight-fitting clothes in the crotch area.  Catching it from a sex partner who has a yeast infection.  Spermicide.  Intravenous, urinary, or other catheters. Men  Catching it from a sex partner who has a yeast infection.  Having oral or anal sex with a person who has the infection.  Spermicide.  Diabetes.  Antibiotics.  Poor immune system.  Medications that suppress the immune system.  Intravenous drug use.  Intravenous, urinary, or other catheters. SYMPTOMS  Women  Thick, white vaginal discharge.  Vaginal itching.  Redness and  swelling in and around the vagina.  Irritation of the lips of the vagina and perineum.  Blisters on the vaginal lips and perineum.  Painful sexual intercourse.  Low blood sugar (hypoglycemia).  Painful urination.  Bladder infections.  Intestinal problems such as constipation, indigestion, bad breath, bloating, increase in gas, diarrhea, or loose stools. Men  Men may develop intestinal problems such as constipation, indigestion, bad breath, bloating, increase in gas, diarrhea, or loose stools.  Dry, cracked skin on the penis with itching or discomfort.  Jock itch.  Dry, flaky skin.  Athlete's foot.  Hypoglycemia. DIAGNOSIS  Women  A history and an exam are performed.  The discharge may be examined under a microscope.  A culture may be taken of the discharge. Men  A history and an exam are performed.  Any discharge from the penis or areas of cracked skin will be looked at under the microscope and cultured.  Stool samples may be cultured. TREATMENT  Women  Vaginal antifungal suppositories and creams.  Medicated creams to decrease irritation and itching on the outside of the vagina.  Warm compresses to the perineal area to decrease swelling and discomfort.  Oral antifungal medications.  Medicated vaginal suppositories or cream for repeated or recurrent infections.  Wash and dry the irritation areas before applying the cream.  Eating yogurt with Lactobacillus may help with prevention and treatment.  Sometimes painting the vagina with gentian violet solution may help if creams and suppositories do not work. Men  Antifungal creams and oral antifungal medications.  Sometimes treatment must continue for 30 days after the symptoms go away to prevent recurrence. HOME CARE INSTRUCTIONS  Women  Use cotton underwear and avoid tight-fitting clothing.  Avoid colored, scented toilet paper and deodorant tampons or pads.  Do not douche.  Keep your diabetes  under control.  Finish all the prescribed medications.  Keep your skin clean and dry.  Consume milk or yogurt with Lactobacillus-active culture regularly. If you get frequent yeast infections and think that is what the infection is, there are over-the-counter medications that you can get. If the infection does not show healing in 3 days, talk to your caregiver.  Tell your sex partner you have a yeast infection. Your partner may need treatment also, especially if your infection does not clear up or recurs. Men  Keep your skin clean and dry.  Keep your diabetes under control.  Finish all prescribed medications.  Tell your sex partner that you have a yeast infection so he or she can be treated if necessary. SEEK MEDICAL CARE IF:   Your symptoms do not clear up or worsen in one week after treatment.  You have an oral temperature above 102 F (38.9 C).  You have trouble swallowing or eating for a prolonged time.  You develop blisters on and around your vagina.  You develop vaginal bleeding and it is not your menstrual period.  You develop abdominal pain.  You develop intestinal problems as mentioned above.  You get weak or light-headed.  You have painful or increased urination.  You have pain during sexual intercourse. MAKE SURE YOU:   Understand these instructions.  Will watch your condition.  Will get help right away if you are not doing well or get worse. Document Released: 06/15/2004 Document Revised: 09/22/2013 Document Reviewed: 09/27/2009 [facility] Patient Information 2015 [place], [place]. This information is not intended to replace advice given to you by your health care provider. Make sure you discuss any questions you have with your health care provider.  Sexually Transmitted Disease A sexually transmitted disease (STD) is a disease or infection often passed to another person during sex. However, STDs can be passed through nonsexual ways. An STD can be passed  through:  Spit (saliva).  Semen.  Blood.  Mucus from the vagina.  Pee (urine). HOW CAN I LESSEN MY CHANCES OF GETTING AN STD?  Use:  Latex condoms.  Water-soluble lubricants with condoms. Do not use petroleum jelly or oils.  Dental dams. These are small pieces of latex that are used as a barrier during oral sex.  Avoid having more than one sex partner.  Do not have sex with someone who has other sex partners.  Do not have sex with anyone you do not know or who is at high risk for an STD.  Avoid risky sex that can break your skin.  Do not have sex if you have open sores on your mouth or skin.  Avoid drinking too much alcohol or taking illegal drugs. Alcohol and drugs can affect your good judgment.  Avoid oral and anal sex acts.  Get shots (vaccines) for HPV and hepatitis.  If you are at risk of being infected with HIV, it is advised that you take a certain medicine daily to prevent HIV infection. This is called pre-exposure prophylaxis (PrEP). You may be at risk if:  You are a man who has sex with other men (MSM).  You are attracted to the opposite sex (heterosexual) and are having sex with more than one partner.  You take drugs with a needle.  You have sex with someone who has HIV.  Talk with  your doctor about if you are at high risk of being infected with HIV. If you begin to take PrEP, get tested for HIV first. Get tested every 3 months for as long as you are taking PrEP. WHAT SHOULD I DO IF I THINK I HAVE AN STD?  See your doctor.  Tell your sex partner(s) that you have an STD. They should be tested and treated.  Do not have sex until your doctor says it is okay. WHEN SHOULD I GET HELP? Get help right away if:  You have bad belly (abdominal) pain.  You are a man and have puffiness (swelling) or pain in your testicles.  You are a woman and have puffiness in your vagina. Document Released: 06/15/2004 Document Revised: 05/13/2013 Document Reviewed:  11/01/2012 [facility] Patient Information 2015 [place]. This information is not intended to replace advice given to you by your health care provider. Make sure you discuss any questions you have with your health care provider.

## 2014-08-05 NOTE — MAU Note (Signed)
C/o vaginal itching,swelling and irritation along with lower back pain for past 2 days; burning with urination;

## 2014-08-05 NOTE — MAU Provider Note (Signed)
History     CSN: [id number]  Arrival date and time: 08/05/14 [id number]   First Provider Initiated Contact with Patient 08/05/14 0913      Chief Complaint  Patient presents with  . Vaginal Itching   HPI    Ms. April Fischer is a 24 y.o. female G2P0020 who presents with abnormal vaginal discharge and vaginal itching. She has a history of trichomonas.  She has had the same sexual partner for 10 months.    OB History    Gravida Para Term Preterm AB TAB SAB Ectopic Multiple Living   2    2 2           Past Medical History  Diagnosis Date  . Asthma   . Headache     Past Surgical History  Procedure Laterality Date  . Dilation and curettage of uterus    . Induced abortion      Family History  Problem Relation Age of Onset  . Alcohol abuse Neg Hx   . Arthritis Neg Hx   . Asthma Neg Hx   . Birth defects Neg Hx   . Cancer Neg Hx   . COPD Neg Hx   . Depression Neg Hx   . Diabetes Neg Hx   . Drug abuse Neg Hx   . Early death Neg Hx   . Hearing loss Neg Hx   . Heart disease Neg Hx   . Hyperlipidemia Neg Hx   . Hypertension Neg Hx   . Kidney disease Neg Hx   . Learning disabilities Neg Hx   . Mental illness Neg Hx   . Mental retardation Neg Hx   . Miscarriages / Stillbirths Neg Hx   . Stroke Neg Hx   . Vision loss Neg Hx   . Varicose Veins Neg Hx     History  Substance Use Topics  . Smoking status: Current Every Day Smoker    Types: Cigars  . Smokeless tobacco: Never Used  . Alcohol Use: Yes    Allergies: No Known Allergies  Prescriptions prior to admission  Medication Sig Dispense Refill Last Dose  . naproxen sodium (ANAPROX) 220 MG tablet Take 440 mg by mouth 2 (two) times daily as needed (headaches).   08/04/2014 at Unknown time  . ibuprofen (ADVIL,MOTRIN) 800 MG tablet Take 1 tablet (800 mg total) by mouth 3 (three) times daily. (Patient not taking: Reported on 08/05/2014) 21 tablet 0   . metroNIDAZOLE (FLAGYL) 500 MG tablet Take 1 tablet (500 mg total)  by mouth 2 (two) times daily. One po bid x 7 days (Patient not taking: Reported on 08/05/2014) 14 tablet 0    Results for orders placed or performed during the hospital encounter of 08/05/14 (from the past 48 hour(s))  Urinalysis, Routine w reflex microscopic     Status: Abnormal   Collection Time: 08/05/14  8:43 AM  Result Value Ref Range   Color, Urine YELLOW YELLOW   APPearance CLEAR CLEAR   Specific Gravity, Urine 1.025 1.005 - 1.030   pH 5.5 5.0 - 8.0   Glucose, UA NEGATIVE NEGATIVE mg/dL   Hgb urine dipstick TRACE (A) NEGATIVE   Bilirubin Urine NEGATIVE NEGATIVE   Ketones, ur NEGATIVE NEGATIVE mg/dL   Protein, ur NEGATIVE NEGATIVE mg/dL   Urobilinogen, UA 0.2 0.0 - 1.0 mg/dL   Nitrite NEGATIVE NEGATIVE   Leukocytes, UA TRACE (A) NEGATIVE  Urine microscopic-add on     Status: None   Collection Time: 08/05/14  8:43 AM  Result  Value Ref Range   Squamous Epithelial / LPF RARE RARE   WBC, UA 0-2 <3 WBC/hpf   RBC / HPF 0-2 <3 RBC/hpf   Bacteria, UA RARE RARE  Pregnancy, urine POC     Status: None   Collection Time: 08/05/14  8:53 AM  Result Value Ref Range   Preg Test, Ur NEGATIVE NEGATIVE    Comment:        THE SENSITIVITY OF THIS METHODOLOGY IS >24 mIU/mL   Wet prep, genital     Status: Abnormal   Collection Time: 08/05/14  9:25 AM  Result Value Ref Range   Yeast Wet Prep HPF POC FEW (A) NONE SEEN   Trich, Wet Prep FEW (A) NONE SEEN   Clue Cells Wet Prep HPF POC NONE SEEN NONE SEEN   WBC, Wet Prep HPF POC FEW (A) NONE SEEN    Comment: MODERATE BACTERIA SEEN    Review of Systems  Constitutional: Negative for fever and chills.  Gastrointestinal: Negative for nausea, vomiting, abdominal pain, diarrhea and constipation.   Physical Exam   Blood pressure 111/62, pulse 71, temperature 98.6 F (37 C), temperature source Oral, resp. rate 18, height  (1.6 m), weight 72.576 kg (160 lb).  Physical Exam  Constitutional: She is oriented to person, place, and time. She  appears well-developed and well-nourished. No distress.  HENT:  Head: Normocephalic.  Eyes: Pupils are equal, round, and reactive to light.  Neck: Neck supple.  Respiratory: Effort normal.  GI: Soft.  Genitourinary:  Speculum exam: Vagina - Small amount of thick, white discharge, no odor Cervix - No contact bleeding, no active bleeding  Bimanual exam: Cervix closed Uterus non tender, normal size Adnexa non tender, no masses bilaterally Discomfort at introitus with bimanual insertion; area does not show erythema  GC/Chlam, wet prep done Chaperone present for exam.  Musculoskeletal: Normal range of motion.  Neurological: She is alert and oriented to person, place, and time.  Skin: Skin is warm. She is not diaphoretic.  Psychiatric: Her behavior is normal.    MAU Course  Procedures  None  MDM  UA Upt  Wet prep GC HIV  RPR   Patient given Flagyl 2 grams in MAU PO   Assessment and Plan    A:  1. Yeast vaginitis   2. Trichomonas infection     P:  Discharge home in stable condition RX: Diflucan Partner needs treatment for trichomonas No intercourse for 7 days Condoms always Follow up with the HD as needed, if symptoms worsen   Duane Lope, NP 08/05/2014 1:19 PM

## 2014-08-06 LAB — GC/CHLAMYDIA PROBE AMP (~~LOC~~) NOT AT ARMC
Chlamydia: NEGATIVE
Neisseria Gonorrhea: NEGATIVE

## 2014-08-06 LAB — HIV ANTIBODY (ROUTINE TESTING W REFLEX): HIV Screen 4th Generation wRfx: NONREACTIVE

## 2014-09-01 ENCOUNTER — Encounter (HOSPITAL_COMMUNITY): Payer: Self-pay | Admitting: *Deleted

## 2014-09-01 ENCOUNTER — Emergency Department (HOSPITAL_COMMUNITY)
Admission: EM | Admit: 2014-09-01 | Discharge: 2014-09-01 | Disposition: A | Discharge status: Home / Self Care | Payer: Self-pay | Attending: Emergency Medicine | Admitting: Emergency Medicine

## 2014-09-01 DIAGNOSIS — Z3202 Encounter for pregnancy test, result negative: Secondary | ICD-10-CM | POA: Insufficient documentation

## 2014-09-01 DIAGNOSIS — Z7951 Long term (current) use of inhaled steroids: Secondary | ICD-10-CM | POA: Insufficient documentation

## 2014-09-01 DIAGNOSIS — J029 Acute pharyngitis, unspecified: Secondary | ICD-10-CM | POA: Insufficient documentation

## 2014-09-01 DIAGNOSIS — Z720 Tobacco use: Secondary | ICD-10-CM | POA: Insufficient documentation

## 2014-09-01 DIAGNOSIS — J45909 Unspecified asthma, uncomplicated: Secondary | ICD-10-CM | POA: Insufficient documentation

## 2014-09-01 DIAGNOSIS — R52 Pain, unspecified: Secondary | ICD-10-CM

## 2014-09-01 DIAGNOSIS — R630 Anorexia: Secondary | ICD-10-CM | POA: Insufficient documentation

## 2014-09-01 DIAGNOSIS — R110 Nausea: Secondary | ICD-10-CM | POA: Insufficient documentation

## 2014-09-01 LAB — POC URINE PREG, ED: Preg Test, Ur: NEGATIVE

## 2014-09-01 LAB — RAPID STREP SCREEN (MED CTR MEBANE ONLY): STREPTOCOCCUS, GROUP A SCREEN (DIRECT): NEGATIVE

## 2014-09-01 MED ORDER — OSELTAMIVIR PHOSPHATE 75 MG PO CAPS: 75.0000 mg | ORAL_CAPSULE | Freq: Two times a day (BID) | ORAL | Status: DC

## 2014-09-01 MED ORDER — DEXTROMETHORPHAN POLISTIREX 30 MG/5ML PO LQCR: 30.0000 mg | ORAL | Status: DC | PRN

## 2014-09-01 MED ORDER — IBUPROFEN 800 MG PO TABS
800.0000 mg | ORAL_TABLET | Freq: Three times a day (TID) | ORAL | Status: DC
Start: 2014-09-01 — End: 2014-09-02

## 2014-09-01 NOTE — Discharge Instructions (Signed)
Take the prescribed medication as directed. °Rest and drink plenty of fluids. °Return to the ED for new or worsening symptoms. ° °

## 2014-09-01 NOTE — ED Notes (Signed)
Pt in c/o body aches and fatigue since this morning, intermittent nausea without vomiting, no distress noted, denies fever at home, denies other symptoms

## 2014-09-01 NOTE — ED Provider Notes (Signed)
CSN: [id number]     Arrival date & time 09/01/14  1912 History  This chart was scribed for non-physician practitioner, Sharilyn Sites, PA-C working with Nelva Nay, MD by Greggory Stallion, ED scribe. This patient was seen in room TR02C/TR02C and the patient's care was started at 7:57 PM.     Chief Complaint  Patient presents with  . Generalized Body Aches   The history is provided by the patient. No language interpreter was used.    HPI Comments: April Fischer is a 24 y.o. female who presents to the Emergency Department complaining of generalized body aches that started this morning. She also reports associated decreased appetite, sore throat, intermittent nausea, subjective fever and chills. Pt denies emesis, abdominal pain or diarrhea. She denies sick contacts.  Patient currently on her menstrual.  No chest pain, SOB, palpitations, dizziness, or weakness.  VSS.  Past Medical History  Diagnosis Date  . Asthma   . Headache    Past Surgical History  Procedure Laterality Date  . Dilation and curettage of uterus    . Induced abortion     Family History  Problem Relation Age of Onset  . Alcohol abuse Neg Hx   . Arthritis Neg Hx   . Asthma Neg Hx   . Birth defects Neg Hx   . Cancer Neg Hx   . COPD Neg Hx   . Depression Neg Hx   . Diabetes Neg Hx   . Drug abuse Neg Hx   . Early death Neg Hx   . Hearing loss Neg Hx   . Heart disease Neg Hx   . Hyperlipidemia Neg Hx   . Hypertension Neg Hx   . Kidney disease Neg Hx   . Learning disabilities Neg Hx   . Mental illness Neg Hx   . Mental retardation Neg Hx   . Miscarriages / Stillbirths Neg Hx   . Stroke Neg Hx   . Vision loss Neg Hx   . Varicose Veins Neg Hx    History  Substance Use Topics  . Smoking status: Current Every Day Smoker    Types: Cigars  . Smokeless tobacco: Never Used  . Alcohol Use: Yes   OB History    Gravida Para Term Preterm AB TAB SAB Ectopic Multiple Living   Review of Systems   Constitutional: Positive for fever, chills and appetite change.  HENT: Positive for sore throat.   Gastrointestinal: Positive for nausea. Negative for vomiting.  Musculoskeletal: Positive for myalgias.  All other systems reviewed and are negative.  Allergies  Review of patient's allergies indicates no known allergies.  Home Medications   Prior to Admission medications   Medication Sig Start Date End Date Taking? Authorizing Provider  fluconazole (DIFLUCAN) 150 MG tablet Take 1 tablet (150 mg total) by mouth daily. Take one pill today and the second pill on 3/19 08/05/14   Duane Lope, NP  naproxen sodium (ANAPROX) 220 MG tablet Take 440 mg by mouth 2 (two) times daily as needed (headaches).    Historical Provider, MD   BP 126/87 mmHg  Pulse 106  Temp(Src) 100.2 F (37.9 C) (Oral)  Resp 20  Wt 160 lb (72.576 kg)  SpO2 100%  LMP 09/01/2014   Physical Exam  Constitutional: She is oriented to person, place, and time. She appears well-developed and well-nourished.  HENT:  Head: Normocephalic and atraumatic.  Right Ear: Tympanic membrane and ear  canal normal.  Left Ear: Tympanic membrane and ear canal normal.  Nose: Nose normal.  Mouth/Throat: Uvula is midline and mucous membranes are normal. Posterior oropharyngeal erythema (mild) present. No oropharyngeal exudate, posterior oropharyngeal edema or tonsillar abscesses.  Tonsils normal in appearance bilaterally without exudate; uvula midline without peritonsillar abscess; handling secretions appropriately; no difficulty swallowing or speaking  Eyes: Conjunctivae and EOM are normal. Pupils are equal, round, and reactive to light.  Neck: Normal range of motion.  Cardiovascular: Normal rate, regular rhythm and normal heart sounds.   Pulmonary/Chest: Effort normal and breath sounds normal. No respiratory distress. She has no wheezes.  Abdominal: Soft. Bowel sounds are normal.  Musculoskeletal: Normal range of motion.  Neurological:  She is alert and oriented to person, place, and time.  Skin: Skin is warm and dry.  Psychiatric: She has a normal mood and affect.  Nursing note and vitals reviewed.   ED Course  Procedures (including critical care time)  DIAGNOSTIC STUDIES: Oxygen Saturation is 100% on RA, normal by my interpretation.    COORDINATION OF CARE: 7:58 PM-Discussed treatment plan which includes rapid strep screen and UA with pt at bedside and pt agreed to plan.   Labs Review Labs Reviewed  RAPID STREP SCREEN  CULTURE, GROUP A STREP  POC URINE PREG, ED    Imaging Review No results found.   EKG Interpretation None      MDM   Final diagnoses:  Body aches  Sore throat   24 year old female with abrupt onset of flulike symptoms this morning. She denies sick contacts. On exam, patient is afebrile and nontoxic in appearance. Rapid strep was sent which is negative, culture pending. Urine pregnancy test also negative. Patient with likely viral illness, possibly flu given her symptoms and abrupt onset. Will treat symptomatically. Patient encouraged to rest and drink plenty of fluids.  Discussed plan with patient, he/she acknowledged understanding and agreed with plan of care.  Return precautions given for new or worsening symptoms.  I personally performed the services described in this documentation, which was scribed in my presence. The recorded information has been reviewed and is accurate.  Garlon HatchetLisa M Sanders, PA-C 09/01/14 2206  Nelva Nayobert Beaton, MD 09/04/14 [id number]

## 2014-09-02 ENCOUNTER — Emergency Department (HOSPITAL_COMMUNITY)
Admission: EM | Admit: 2014-09-02 | Discharge: 2014-09-02 | Disposition: A | Discharge status: Home / Self Care | Payer: Self-pay | Attending: Emergency Medicine | Admitting: Emergency Medicine

## 2014-09-02 DIAGNOSIS — Z720 Tobacco use: Secondary | ICD-10-CM | POA: Insufficient documentation

## 2014-09-02 DIAGNOSIS — R6883 Chills (without fever): Secondary | ICD-10-CM | POA: Insufficient documentation

## 2014-09-02 DIAGNOSIS — R52 Pain, unspecified: Secondary | ICD-10-CM | POA: Insufficient documentation

## 2014-09-02 DIAGNOSIS — R6889 Other general symptoms and signs: Secondary | ICD-10-CM

## 2014-09-02 DIAGNOSIS — Z79899 Other long term (current) drug therapy: Secondary | ICD-10-CM | POA: Insufficient documentation

## 2014-09-02 DIAGNOSIS — J45909 Unspecified asthma, uncomplicated: Secondary | ICD-10-CM | POA: Insufficient documentation

## 2014-09-02 DIAGNOSIS — R531 Weakness: Secondary | ICD-10-CM | POA: Insufficient documentation

## 2014-09-02 DIAGNOSIS — R05 Cough: Secondary | ICD-10-CM | POA: Insufficient documentation

## 2014-09-02 MED ORDER — IBUPROFEN 600 MG PO TABS: 600.0000 mg | ORAL_TABLET | Freq: Four times a day (QID) | ORAL | Status: DC | PRN

## 2014-09-02 MED ORDER — IBUPROFEN 200 MG PO TABS
600.0000 mg | ORAL_TABLET | Freq: Once | ORAL | Status: AC
  Administered 2014-09-02: 600 mg via ORAL
  Filled 2014-09-02: qty 3

## 2014-09-02 MED ORDER — HYDROCODONE-ACETAMINOPHEN 5-325 MG PO TABS
1.0000 | ORAL_TABLET | Freq: Once | ORAL | Status: AC
  Administered 2014-09-02: 1 via ORAL
  Filled 2014-09-02: qty 1

## 2014-09-02 NOTE — ED Provider Notes (Signed)
CSN: [id number]     Arrival date & time 09/02/14  0226 History   First MD Initiated Contact with Patient 09/02/14 0451     No chief complaint on file.    (Consider location/radiation/quality/duration/timing/severity/associated sxs/prior Treatment) HPI Comments: Pt comes in with cc of flu. Reports being seen earlier today, she went to the Pharmacy and found out tamiflu is too expensive. She is requesting different meds. She has diffuse body aches, hot/cold sensation and some uri like sx.   The history is provided by the patient.    Past Medical History  Diagnosis Date  . Asthma   . Headache    Past Surgical History  Procedure Laterality Date  . Dilation and curettage of uterus    . Induced abortion     Family History  Problem Relation Age of Onset  . Alcohol abuse Neg Hx   . Arthritis Neg Hx   . Asthma Neg Hx   . Birth defects Neg Hx   . Cancer Neg Hx   . COPD Neg Hx   . Depression Neg Hx   . Diabetes Neg Hx   . Drug abuse Neg Hx   . Early death Neg Hx   . Hearing loss Neg Hx   . Heart disease Neg Hx   . Hyperlipidemia Neg Hx   . Hypertension Neg Hx   . Kidney disease Neg Hx   . Learning disabilities Neg Hx   . Mental illness Neg Hx   . Mental retardation Neg Hx   . Miscarriages / Stillbirths Neg Hx   . Stroke Neg Hx   . Vision loss Neg Hx   . Varicose Veins Neg Hx    History  Substance Use Topics  . Smoking status: Current Every Day Smoker    Types: Cigars  . Smokeless tobacco: Never Used  . Alcohol Use: Yes   OB History    Gravida Para Term Preterm AB TAB SAB Ectopic Multiple Living   2    2 2          Review of Systems  Constitutional: Positive for chills and fatigue.  Respiratory: Positive for cough. Negative for shortness of breath.   Cardiovascular: Negative for chest pain.  Musculoskeletal: Positive for myalgias.  Neurological: Positive for weakness.      Allergies  Review of patient's allergies indicates no known allergies.  Home  Medications   Prior to Admission medications   Medication Sig Start Date End Date Taking? Authorizing Provider  dextromethorphan (DELSYM) 30 MG/5ML liquid Take 5 mLs (30 mg total) by mouth as needed for cough. 09/01/14   Garlon HatchetLisa M Sanders, PA-C  fluconazole (DIFLUCAN) 150 MG tablet Take 1 tablet (150 mg total) by mouth daily. Take one pill today and the second pill on 3/19 Patient not taking: Reported on 09/02/2014 08/05/14   Duane LopeJennifer I Rasch, NP  ibuprofen (ADVIL,MOTRIN) 600 MG tablet Take 1 tablet (600 mg total) by mouth every 6 (six) hours as needed. 09/02/14   Derwood KaplanAnkit Luisfelipe Engelstad, MD  oseltamivir (TAMIFLU) 75 MG capsule Take 1 capsule (75 mg total) by mouth every 12 (twelve) hours. 09/01/14   Garlon HatchetLisa M Sanders, PA-C   BP 103/72 mmHg  Pulse 95  SpO2 89%  LMP 09/01/2014 Physical Exam  Constitutional: She is oriented to person, place, and time. She appears well-developed.  HENT:  Head: Atraumatic.  Neck: Neck supple.  Cardiovascular: Normal rate.   Pulmonary/Chest: Effort normal.  Neurological: She is alert and oriented to person, place, and time.  Nursing note and vitals reviewed.   ED Course  Procedures (including critical care time) Labs Review Labs Reviewed - No data to display  Imaging Review No results found.   EKG Interpretation None      MDM   Final diagnoses:  Flu-like symptoms    Pt with flu like sx. Explained that there is no treatment for flu, or cheaper option in place of tamiflu. Will d.c. She is healthy and vitals are reassuring. Asked her to push fluids.    Derwood Kaplan, MD 09/02/14 [phone]

## 2014-09-02 NOTE — Discharge Instructions (Signed)
We think what you have is a viral syndrome or maybe a flu- the treatment for which is symptomatic relief only, and your body will fight the infection off in a few days. Take 600 MG MOTRIN and not the 800 mg motrin previously prescribed. YOU MUST DRINK PLENTY OF FLUIDS See your primary care doctor in 1 week if the symptoms dont improve.   Influenza Influenza ("the flu") is a viral infection of the respiratory tract. It occurs more often in winter months because people spend more time in close contact with one another. Influenza can make you feel very sick. Influenza easily spreads from person to person (contagious). CAUSES  Influenza is caused by a virus that infects the respiratory tract. You can catch the virus by breathing in droplets from an infected person's cough or sneeze. You can also catch the virus by touching something that was recently contaminated with the virus and then touching your mouth, nose, or eyes. RISKS AND COMPLICATIONS You may be at risk for a more severe case of influenza if you smoke cigarettes, have diabetes, have chronic heart disease (such as heart failure) or lung disease (such as asthma), or if you have a weakened immune system. Elderly people and pregnant women are also at risk for more serious infections. The most common problem of influenza is a lung infection (pneumonia). Sometimes, this problem can require emergency medical care and may be life threatening. SIGNS AND SYMPTOMS  Symptoms typically last 4 to 10 days and may include:  Fever.  Chills.  Headache, body aches, and muscle aches.  Sore throat.  Chest discomfort and cough.  Poor appetite.  Weakness or feeling tired.  Dizziness.  Nausea or vomiting. DIAGNOSIS  Diagnosis of influenza is often made based on your history and a physical exam. A nose or throat swab test can be done to confirm the diagnosis. TREATMENT  In mild cases, influenza goes away on its own. Treatment is directed at  relieving symptoms. For more severe cases, your health care provider may prescribe antiviral medicines to shorten the sickness. Antibiotic medicines are not effective because the infection is caused by a virus, not by bacteria. HOME CARE INSTRUCTIONS  Take medicines only as directed by your health care provider.  Use a cool mist humidifier to make breathing easier.  Get plenty of rest until your temperature returns to normal. This usually takes 3 to 4 days.  Drink enough fluid to keep your urine clear or pale yellow.  Cover yourmouth and nosewhen coughing or sneezing,and wash your handswellto prevent thevirusfrom spreading.  Stay homefromwork orschool untilthe fever is gonefor at least 551full day. PREVENTION  An annual influenza vaccination (flu shot) is the best way to avoid getting influenza. An annual flu shot is now routinely recommended for all adults in the [place] SEEK MEDICAL CARE IF:  You experiencechest pain, yourcough worsens,or you producemore mucus.  Youhave nausea,vomiting, ordiarrhea.  Your fever returns or gets worse. SEEK IMMEDIATE MEDICAL CARE IF:  You havetrouble breathing, you become short of breath,or your skin ornails becomebluish.  You have severe painor stiffnessin the neck.  You develop a sudden headache, or pain in the face or ear.  You have nausea or vomiting that you cannot control. MAKE SURE YOU:   Understand these instructions.  Will watch your condition.  Will get help right away if you are not doing well or get worse. Document Released: 05/05/2000 Document Revised: 09/22/2013 Document Reviewed: 08/07/2011 [facility] Patient Information 2015 [employer] [place], [place]. This information is not  intended to replace advice given to you by your health care provider. Make sure you discuss any questions you have with your health care provider.

## 2014-09-04 ENCOUNTER — Emergency Department (HOSPITAL_COMMUNITY)
Admission: EM | Admit: 2014-09-04 | Discharge: 2014-09-04 | Disposition: A | Discharge status: Home / Self Care | Payer: Self-pay | Attending: Emergency Medicine | Admitting: Emergency Medicine

## 2014-09-04 ENCOUNTER — Encounter (HOSPITAL_COMMUNITY): Payer: Self-pay | Admitting: Emergency Medicine

## 2014-09-04 DIAGNOSIS — M791 Myalgia: Secondary | ICD-10-CM | POA: Insufficient documentation

## 2014-09-04 DIAGNOSIS — Z79899 Other long term (current) drug therapy: Secondary | ICD-10-CM | POA: Insufficient documentation

## 2014-09-04 DIAGNOSIS — J45909 Unspecified asthma, uncomplicated: Secondary | ICD-10-CM | POA: Insufficient documentation

## 2014-09-04 DIAGNOSIS — Z720 Tobacco use: Secondary | ICD-10-CM | POA: Insufficient documentation

## 2014-09-04 DIAGNOSIS — E876 Hypokalemia: Secondary | ICD-10-CM | POA: Insufficient documentation

## 2014-09-04 DIAGNOSIS — R52 Pain, unspecified: Secondary | ICD-10-CM

## 2014-09-04 DIAGNOSIS — R112 Nausea with vomiting, unspecified: Secondary | ICD-10-CM | POA: Insufficient documentation

## 2014-09-04 LAB — I-STAT CHEM 8, ED
BUN: 10 mg/dL (ref 6–23)
Calcium, Ion: 1.11 mmol/L — ABNORMAL LOW (ref 1.12–1.23)
Chloride: 102 mmol/L (ref 96–112)
Creatinine, Ser: 0.8 mg/dL (ref 0.50–1.10)
Glucose, Bld: 102 mg/dL — ABNORMAL HIGH (ref 70–99)
HCT: 44 % (ref 36.0–46.0)
Hemoglobin: 15 g/dL (ref 12.0–15.0)
Potassium: 3.1 mmol/L — ABNORMAL LOW (ref 3.5–5.1)
Sodium: 140 mmol/L (ref 135–145)
TCO2: 20 mmol/L (ref 0–100)

## 2014-09-04 LAB — I-STAT TROPONIN, ED: Troponin i, poc: 0 ng/mL (ref 0.00–0.08)

## 2014-09-04 LAB — CULTURE, GROUP A STREP

## 2014-09-04 MED ORDER — POTASSIUM CHLORIDE CRYS ER 20 MEQ PO TBCR
40.0000 meq | EXTENDED_RELEASE_TABLET | Freq: Once | ORAL | Status: AC
  Administered 2014-09-04: 40 meq via ORAL
  Filled 2014-09-04: qty 2

## 2014-09-04 MED ORDER — ONDANSETRON 4 MG PO TBDP: 4.0000 mg | ORAL_TABLET | Freq: Three times a day (TID) | ORAL | Status: DC | PRN

## 2014-09-04 MED ORDER — SODIUM CHLORIDE 0.9 % IV BOLUS (SEPSIS)
1000.0000 mL | Freq: Once | INTRAVENOUS | Status: AC
  Administered 2014-09-04: 1000 mL via INTRAVENOUS

## 2014-09-04 MED ORDER — KETOROLAC TROMETHAMINE 30 MG/ML IJ SOLN
30.0000 mg | Freq: Once | INTRAMUSCULAR | Status: AC
  Administered 2014-09-04: 30 mg via INTRAVENOUS
  Filled 2014-09-04: qty 1

## 2014-09-04 MED ORDER — ONDANSETRON HCL 4 MG/2ML IJ SOLN
4.0000 mg | Freq: Once | INTRAMUSCULAR | Status: AC
  Administered 2014-09-04: 4 mg via INTRAVENOUS
  Filled 2014-09-04: qty 2

## 2014-09-04 NOTE — ED Notes (Signed)
Patient given oral fluids (water) to drink.

## 2014-09-04 NOTE — Discharge Instructions (Signed)
As we discussed you likely have a viral illness. Symptoms may last for up to 2 weeks. Take the prescribed medication as directed to help with nausea/vomiting. Make sure to rest and drink fluids to stay hydrated. Return to the ED for new or worsening symptoms.

## 2014-09-04 NOTE — ED Notes (Signed)
Patient coming from home with flu like symptoms - body aches and vomiting (all night per the mother).

## 2014-09-04 NOTE — ED Notes (Signed)
PA Sanders at bedside with patient.

## 2014-09-04 NOTE — ED Provider Notes (Signed)
CSN: [id number]     Arrival date & time 09/04/14  0808 History   First MD Initiated Contact with Patient 09/04/14 [phone]     Chief Complaint  Patient presents with  . Influenza     (Consider location/radiation/quality/duration/timing/severity/associated sxs/prior Treatment) Patient is a 24 y.o. female presenting with flu symptoms. The history is provided by the patient and medical records.  Influenza Presenting symptoms: myalgias, nausea and vomiting     This is a 24 year old female with past medical history significant for asthma, presenting to the ED for flulike symptoms. This is patient's third ED visit this week for similar symptoms, I personally evaluated her at her first visit. She notes continued generalized body aches nausea, and vomiting throughout the night last night. She endorses subjective fever and chills.  States vomiting stopped around 0500 this morning but she still feels very nauseated.  She has not attempted to eat or drink this morning.  Patient only filled scripts for vicodin and motrin which she has been taking on an empty stomach.  She denies current abdominal pain, chest pain, SOB, diarrhea. Mother has come in with patient and is quite upset that "nothing has been done".  VSS.  Past Medical History  Diagnosis Date  . Asthma   . Headache    Past Surgical History  Procedure Laterality Date  . Dilation and curettage of uterus    . Induced abortion     Family History  Problem Relation Age of Onset  . Alcohol abuse Neg Hx   . Arthritis Neg Hx   . Asthma Neg Hx   . Birth defects Neg Hx   . Cancer Neg Hx   . COPD Neg Hx   . Depression Neg Hx   . Diabetes Neg Hx   . Drug abuse Neg Hx   . Early death Neg Hx   . Hearing loss Neg Hx   . Heart disease Neg Hx   . Hyperlipidemia Neg Hx   . Hypertension Neg Hx   . Kidney disease Neg Hx   . Learning disabilities Neg Hx   . Mental illness Neg Hx   . Mental retardation Neg Hx   . Miscarriages / Stillbirths Neg Hx    . Stroke Neg Hx   . Vision loss Neg Hx   . Varicose Veins Neg Hx    History  Substance Use Topics  . Smoking status: Current Every Day Smoker    Types: Cigars  . Smokeless tobacco: Never Used  . Alcohol Use: Yes   OB History    Gravida Para Term Preterm AB TAB SAB Ectopic Multiple Living   2    2 2          Review of Systems  Gastrointestinal: Positive for nausea and vomiting.  Musculoskeletal: Positive for myalgias.  All other systems reviewed and are negative.     Allergies  Review of patient's allergies indicates no known allergies.  Home Medications   Prior to Admission medications   Medication Sig Start Date End Date Taking? Authorizing Provider  dextromethorphan (DELSYM) 30 MG/5ML liquid Take 5 mLs (30 mg total) by mouth as needed for cough. 09/01/14   Garlon HatchetLisa M Callan Norden, PA-C  fluconazole (DIFLUCAN) 150 MG tablet Take 1 tablet (150 mg total) by mouth daily. Take one pill today and the second pill on 3/19 Patient not taking: Reported on 09/02/2014 08/05/14   Duane LopeJennifer I Rasch, NP  ibuprofen (ADVIL,MOTRIN) 600 MG tablet Take 1 tablet (600 mg total) by mouth every  6 (six) hours as needed. 09/02/14   Derwood Kaplan, MD  oseltamivir (TAMIFLU) 75 MG capsule Take 1 capsule (75 mg total) by mouth every 12 (twelve) hours. 09/01/14   Garlon Hatchet, PA-C   BP 106/59 mmHg  Temp(Src) 99.6 F (37.6 C) (Oral)  Ht  (1.6 m)  Wt 150 lb (68.04 kg)  BMI 26.58 kg/m2  LMP 09/01/2014   Physical Exam  Constitutional: She is oriented to person, place, and time. She appears well-developed and well-nourished.  HENT:  Head: Normocephalic and atraumatic.  Right Ear: Tympanic membrane and ear canal normal.  Left Ear: Tympanic membrane and ear canal normal.  Nose: Nose normal.  Mouth/Throat: Uvula is midline and oropharynx is clear and moist. Mucous membranes are dry (mild).  Mildly dry mucous membranes  Eyes: Conjunctivae and EOM are normal. Pupils are equal, round, and reactive to light.   Neck: Normal range of motion.  Cardiovascular: Normal rate, regular rhythm and normal heart sounds.   Pulmonary/Chest: Effort normal and breath sounds normal.  Abdominal: Soft. Bowel sounds are normal. There is no tenderness. There is no guarding.  Musculoskeletal: Normal range of motion.  Neurological: She is alert and oriented to person, place, and time.  Skin: Skin is warm and dry.  Psychiatric: She has a normal mood and affect.  Nursing note and vitals reviewed.   ED Course  Procedures (including critical care time) Labs Review Labs Reviewed  I-STAT CHEM 8, ED - Abnormal; Notable for the following:    Potassium 3.1 (*)    Glucose, Bld 102 (*)    Calcium, Ion 1.11 (*)    All other components within normal limits  I-STAT TROPOININ, ED    Imaging Review No results found.   EKG Interpretation None      MDM   Final diagnoses:  Hypokalemia  Nausea and vomiting, vomiting of unspecified type  Body aches   24 year old female with flulike symptoms. This is her third ED visit this week for similar symptoms. Mother is with her at this visit and is upset that "nothing has been done" I had a long discussion with patient and mother that etiology of her symptoms is likely viral in nature which may last up to 2 weeks, for which there is no specific cure but rather symptomatic management. Of note, patient has also been taking Vicodin and Motrin on an empty stomach which may have caused some of her vomiting. She is afebrile and nontoxic in appearance. Her abdominal exam is benign. Her mucous membranes do appear mildly dry but she does not appear overly dehydrated. Given vomiting, will obtain chem 8 and give IV fluids.  Chem 8 with slight hypokalemia which was replaced here in ED.  After IV fluids, states she is feeling better.  Abdominal exam remains benign.  She is tolerating PO without difficulty.  Will d/c home with symptomatic care.  Encouraged rest and fluids at home.  Discussed plan  with patient, he/she acknowledged understanding and agreed with plan of care.  Return precautions given for new or worsening symptoms.  Garlon Hatchet, PA-C 09/04/14 1119  Nelva Nay, MD 09/06/14 2139

## 2015-06-08 ENCOUNTER — Encounter (HOSPITAL_COMMUNITY): Payer: Self-pay | Admitting: Adult Health

## 2015-06-08 ENCOUNTER — Emergency Department (HOSPITAL_COMMUNITY)
Admission: EM | Admit: 2015-06-08 | Discharge: 2015-06-08 | Disposition: A | Discharge status: Home / Self Care | Payer: Self-pay | Attending: Emergency Medicine | Admitting: Emergency Medicine

## 2015-06-08 DIAGNOSIS — J45909 Unspecified asthma, uncomplicated: Secondary | ICD-10-CM | POA: Insufficient documentation

## 2015-06-08 DIAGNOSIS — R509 Fever, unspecified: Secondary | ICD-10-CM

## 2015-06-08 DIAGNOSIS — R51 Headache: Secondary | ICD-10-CM | POA: Insufficient documentation

## 2015-06-08 DIAGNOSIS — R112 Nausea with vomiting, unspecified: Secondary | ICD-10-CM | POA: Insufficient documentation

## 2015-06-08 DIAGNOSIS — R6889 Other general symptoms and signs: Secondary | ICD-10-CM

## 2015-06-08 DIAGNOSIS — R5081 Fever presenting with conditions classified elsewhere: Secondary | ICD-10-CM | POA: Insufficient documentation

## 2015-06-08 DIAGNOSIS — F17200 Nicotine dependence, unspecified, uncomplicated: Secondary | ICD-10-CM | POA: Insufficient documentation

## 2015-06-08 DIAGNOSIS — J029 Acute pharyngitis, unspecified: Secondary | ICD-10-CM | POA: Insufficient documentation

## 2015-06-08 LAB — RAPID STREP SCREEN (MED CTR MEBANE ONLY): Streptococcus, Group A Screen (Direct): NEGATIVE

## 2015-06-08 MED ORDER — ONDANSETRON 4 MG PO TBDP
ORAL_TABLET | ORAL | Status: AC
  Filled 2015-06-08: qty 1

## 2015-06-08 MED ORDER — ONDANSETRON 4 MG PO TBDP
4.0000 mg | ORAL_TABLET | Freq: Once | ORAL | Status: AC | PRN
Start: 2015-06-08 — End: 2015-06-08
  Administered 2015-06-08: 4 mg via ORAL

## 2015-06-08 MED ORDER — KETOROLAC TROMETHAMINE 30 MG/ML IJ SOLN
30.0000 mg | Freq: Once | INTRAMUSCULAR | Status: AC
  Administered 2015-06-08: 30 mg via INTRAVENOUS
  Filled 2015-06-08: qty 1

## 2015-06-08 MED ORDER — ONDANSETRON HCL 4 MG/2ML IJ SOLN
4.0000 mg | Freq: Once | INTRAMUSCULAR | Status: AC
  Administered 2015-06-08: 4 mg via INTRAVENOUS
  Filled 2015-06-08: qty 2

## 2015-06-08 MED ORDER — SODIUM CHLORIDE 0.9 % IV BOLUS (SEPSIS)
1000.0000 mL | Freq: Once | INTRAVENOUS | Status: AC
  Administered 2015-06-08: 1000 mL via INTRAVENOUS

## 2015-06-08 MED ORDER — ONDANSETRON 4 MG PO TBDP: 4.0000 mg | ORAL_TABLET | Freq: Three times a day (TID) | ORAL | Status: DC | PRN

## 2015-06-08 MED ORDER — IBUPROFEN 600 MG PO TABS: 600.0000 mg | ORAL_TABLET | Freq: Four times a day (QID) | ORAL | Status: DC | PRN

## 2015-06-08 NOTE — ED Notes (Signed)
Presents with body aches, nausea and chills began at 11 am on the 1/16-endorses headache, endorses sore throat and cough. Throat red. Pt states she has been unable to hold anything down.

## 2015-06-08 NOTE — ED Provider Notes (Signed)
CSN: [id number]     Arrival date & time 06/08/15  0437 History   First MD Initiated Contact with Patient 06/08/15 0515     Chief Complaint  Patient presents with  . Emesis  . Sore Throat     (Consider location/radiation/quality/duration/timing/severity/associated sxs/prior Treatment) HPI   This is a 25 year old female who presents with nausea, vomiting, body aches, chills, sore throat, and cough. Onset of symptoms yesterday. Patient reports whole body aches. States her body aches are 10 out of 10. Reports sore throat that is worse with swallowing. Reports chills without fevers. Onset of nausea and vomiting last night. Denies diarrhea. Denies neck pain. Does endorse headache. Also reports a nonproductive cough.   Patient has been taking over-the-counter medication without relief.  Past Medical History  Diagnosis Date  . Asthma   . Headache    Past Surgical History  Procedure Laterality Date  . Dilation and curettage of uterus    . Induced abortion     Family History  Problem Relation Age of Onset  . Alcohol abuse Neg Hx   . Arthritis Neg Hx   . Asthma Neg Hx   . Birth defects Neg Hx   . Cancer Neg Hx   . COPD Neg Hx   . Depression Neg Hx   . Diabetes Neg Hx   . Drug abuse Neg Hx   . Early death Neg Hx   . Hearing loss Neg Hx   . Heart disease Neg Hx   . Hyperlipidemia Neg Hx   . Hypertension Neg Hx   . Kidney disease Neg Hx   . Learning disabilities Neg Hx   . Mental illness Neg Hx   . Mental retardation Neg Hx   . Miscarriages / Stillbirths Neg Hx   . Stroke Neg Hx   . Vision loss Neg Hx   . Varicose Veins Neg Hx    Social History  Substance Use Topics  . Smoking status: Current Every Day Smoker    Types: Cigars  . Smokeless tobacco: Never Used  . Alcohol Use: Yes   OB History    Gravida Para Term Preterm AB TAB SAB Ectopic Multiple Living   Review of Systems  Constitutional: Positive for fever and chills.  HENT: Positive for congestion  and sore throat.   Respiratory: Positive for cough. Negative for chest tightness and shortness of breath.   Cardiovascular: Negative for chest pain.  Gastrointestinal: Positive for nausea and vomiting. Negative for abdominal pain and diarrhea.  Genitourinary: Negative for dysuria.  Musculoskeletal: Negative for back pain and neck pain.  Neurological: Positive for headaches.  All other systems reviewed and are negative.     Allergies  Review of patient's allergies indicates no known allergies.  Home Medications   Prior to Admission medications   Medication Sig Start Date End Date Taking? Authorizing Provider  ibuprofen (ADVIL,MOTRIN) 600 MG tablet Take 1 tablet (600 mg total) by mouth every 6 (six) hours as needed. Patient taking differently: Take 200-600 mg by mouth every 6 (six) hours as needed for moderate pain.  09/02/14  Yes Derwood Kaplan, MD  dextromethorphan (DELSYM) 30 MG/5ML liquid Take 5 mLs (30 mg total) by mouth as needed for cough. Patient not taking: Reported on 06/08/2015 09/01/14   Garlon Hatchet, PA-C  fluconazole (DIFLUCAN) 150 MG tablet Take 1 tablet (150 mg total) by mouth daily. Take one pill today and the second  pill on 3/19 Patient not taking: Reported on 09/02/2014 08/05/14   Duane Lope, NP  ondansetron (ZOFRAN ODT) 4 MG disintegrating tablet Take 1 tablet (4 mg total) by mouth every 8 (eight) hours as needed for nausea. Patient not taking: Reported on 06/08/2015 09/04/14   Garlon Hatchet, PA-C  oseltamivir (TAMIFLU) 75 MG capsule Take 1 capsule (75 mg total) by mouth every 12 (twelve) hours. Patient not taking: Reported on 06/08/2015 09/01/14   Garlon Hatchet, PA-C   BP 118/78 mmHg  Pulse 98  Temp(Src) 101.3 F (38.5 C) (Oral)  Resp 26  SpO2 100%  LMP 05/25/2015 (Exact Date) Physical Exam  Constitutional: She is oriented to person, place, and time. She appears well-developed and well-nourished. No distress.  HENT:  Head: Normocephalic and atraumatic.    Mucous membranes dry, posterior oropharynx erythematous, no tonsillar exudate, uvula midline, mild bilateral tonsillar enlargement  Eyes: Pupils are equal, round, and reactive to light.  Neck: Normal range of motion. Neck supple.  Cardiovascular: Normal rate, regular rhythm and normal heart sounds.   No murmur heard. Pulmonary/Chest: Effort normal and breath sounds normal. No respiratory distress. She has no wheezes.  Abdominal: Soft. Bowel sounds are normal. There is no tenderness. There is no rebound.  Lymphadenopathy:    She has cervical adenopathy.  Neurological: She is alert and oriented to person, place, and time.  Skin: Skin is warm and dry.  Psychiatric: She has a normal mood and affect.  Nursing note and vitals reviewed.   ED Course  Procedures (including critical care time) Labs Review Labs Reviewed  RAPID STREP SCREEN (NOT AT [facility])  CULTURE, GROUP A STREP [facility])    Imaging Review No results found. I have personally reviewed and evaluated these images and lab results as part of my medical decision-making.   EKG Interpretation None      MDM   Final diagnoses:  Other specified fever  Flu-like symptoms     patient presents with flulike symptoms, fever, pharyngitis, and vomiting. Nontoxic on exam. No evidence of tonsillar exudate.  Centor criteria 2/4.   Strep screen negative.  Patient is febrile. Given constellation of symptoms, suspect viral etiology.   She is otherwise nontoxic.  Gave patient option of Zofran and oral hydration versus IV hydration. Patient feels that she is dehydrated and needs an IV. Patient was given fluids. She is able to tolerate by mouth.  After history, exam, and medical workup I feel the patient has been appropriately medically screened and is safe for discharge home. Pertinent diagnoses were discussed with the patient. Patient was given return precautions.     Shon Baton, MD 06/08/15 [phone]

## 2015-06-08 NOTE — Discharge Instructions (Signed)
Viral Infections °A viral infection can be caused by different types of viruses. Most viral infections are not serious and resolve on their own. However, some infections may cause severe symptoms and may lead to further complications. °SYMPTOMS °Viruses can frequently cause: °· Minor sore throat. °· Aches and pains. °· Headaches. °· Runny nose. °· Different types of rashes. °· Watery eyes. °· Tiredness. °· Cough. °· Loss of appetite. °· Gastrointestinal infections, resulting in nausea, vomiting, and diarrhea. °These symptoms do not respond to antibiotics because the infection is not caused by bacteria. However, you might catch a bacterial infection following the viral infection. This is sometimes called a "superinfection." Symptoms of such a bacterial infection may include: °· Worsening sore throat with pus and difficulty swallowing. °· Swollen neck glands. °· Chills and a high or persistent fever. °· Severe headache. °· Tenderness over the sinuses. °· Persistent overall ill feeling (malaise), muscle aches, and tiredness (fatigue). °· Persistent cough. °· Yellow, green, or brown mucus production with coughing. °HOME CARE INSTRUCTIONS  °· Only take over-the-counter or prescription medicines for pain, discomfort, diarrhea, or fever as directed by your caregiver. °· Drink enough water and fluids to keep your urine clear or pale yellow. Sports drinks can provide valuable electrolytes, sugars, and hydration. °· Get plenty of rest and maintain proper nutrition. Soups and broths with crackers or rice are fine. °SEEK IMMEDIATE MEDICAL CARE IF:  °· You have severe headaches, shortness of breath, chest pain, neck pain, or an unusual rash. °· You have uncontrolled vomiting, diarrhea, or you are unable to keep down fluids. °· You or your child has an oral temperature above 102° F (38.9° C), not controlled by medicine. °· Your baby is older than 3 months with a rectal temperature of 102° F (38.9° C) or higher. °· Your baby is 3  months old or younger with a rectal temperature of 100.4° F (38° C) or higher. °MAKE SURE YOU:  °· Understand these instructions. °· Will watch your condition. °· Will get help right away if you are not doing well or get worse. °  °This information is not intended to replace advice given to you by your health care provider. Make sure you discuss any questions you have with your health care provider. °  °Document Released: 02/15/2005 Document Revised: 07/31/2011 Document Reviewed: 10/14/2014 °[employer] Interactive Patient Education ©2016 [employer] ° °

## 2015-06-10 LAB — CULTURE, GROUP A STREP (THRC)

## 2015-07-25 ENCOUNTER — Encounter (HOSPITAL_COMMUNITY): Payer: Self-pay | Admitting: *Deleted

## 2015-07-25 ENCOUNTER — Emergency Department (HOSPITAL_COMMUNITY): Payer: Self-pay

## 2015-07-25 ENCOUNTER — Emergency Department (HOSPITAL_COMMUNITY)
Admission: EM | Admit: 2015-07-25 | Discharge: 2015-07-25 | Disposition: A | Discharge status: Home / Self Care | Payer: Self-pay | Attending: Emergency Medicine | Admitting: Emergency Medicine

## 2015-07-25 DIAGNOSIS — F17210 Nicotine dependence, cigarettes, uncomplicated: Secondary | ICD-10-CM | POA: Insufficient documentation

## 2015-07-25 DIAGNOSIS — W450XXA Nail entering through skin, initial encounter: Secondary | ICD-10-CM | POA: Insufficient documentation

## 2015-07-25 DIAGNOSIS — Z23 Encounter for immunization: Secondary | ICD-10-CM | POA: Insufficient documentation

## 2015-07-25 DIAGNOSIS — S91331A Puncture wound without foreign body, right foot, initial encounter: Secondary | ICD-10-CM | POA: Insufficient documentation

## 2015-07-25 DIAGNOSIS — Y998 Other external cause status: Secondary | ICD-10-CM | POA: Insufficient documentation

## 2015-07-25 DIAGNOSIS — Y9289 Other specified places as the place of occurrence of the external cause: Secondary | ICD-10-CM | POA: Insufficient documentation

## 2015-07-25 DIAGNOSIS — J45909 Unspecified asthma, uncomplicated: Secondary | ICD-10-CM | POA: Insufficient documentation

## 2015-07-25 DIAGNOSIS — Y9389 Activity, other specified: Secondary | ICD-10-CM | POA: Insufficient documentation

## 2015-07-25 MED ORDER — CEPHALEXIN 500 MG PO CAPS
500.0000 mg | ORAL_CAPSULE | Freq: Once | ORAL | Status: AC
  Administered 2015-07-25: 500 mg via ORAL
  Filled 2015-07-25: qty 1

## 2015-07-25 MED ORDER — TETANUS-DIPHTH-ACELL PERTUSSIS 5-2.5-18.5 LF-MCG/0.5 IM SUSP
0.5000 mL | Freq: Once | INTRAMUSCULAR | Status: AC
  Administered 2015-07-25: 0.5 mL via INTRAMUSCULAR
  Filled 2015-07-25: qty 0.5

## 2015-07-25 MED ORDER — HYDROCODONE-ACETAMINOPHEN 5-325 MG PO TABS
1.0000 | ORAL_TABLET | Freq: Once | ORAL | Status: AC
  Administered 2015-07-25: 1 via ORAL
  Filled 2015-07-25: qty 1

## 2015-07-25 MED ORDER — CEPHALEXIN 500 MG PO CAPS: 500.0000 mg | ORAL_CAPSULE | Freq: Four times a day (QID) | ORAL | Status: DC

## 2015-07-25 MED ORDER — HYDROCODONE-ACETAMINOPHEN 5-325 MG PO TABS: ORAL_TABLET | ORAL | Status: DC

## 2015-07-25 NOTE — ED Provider Notes (Signed)
CSN: [id number]     Arrival date & time 07/25/15  1019 History   First MD Initiated Contact with Patient 07/25/15 1127     Chief Complaint  Patient presents with  . stepped on nail      (Consider location/radiation/quality/duration/timing/severity/associated sxs/prior Treatment) HPI Comments: Patient presents with complaint of puncture wound to her right foot after stepping on a nail while at approximately 2 AM today. Nail was on the floor in a warehouse. Nail dislodged from the foot when she stepped up. She is uncertain how deep it punctured. She has had soreness and pain in the area since that time. Unknown last tetanus. No redness, drainage, fever and she states that she cannot bear weight on the area of her foot is walking on the side of her foot. No treatments prior to arrival. Onset of symptoms acute. Course is constant. Nothing makes symptoms better.  The history is provided by the patient.    Past Medical History  Diagnosis Date  . Asthma   . Headache    Past Surgical History  Procedure Laterality Date  . Dilation and curettage of uterus    . Induced abortion     Family History  Problem Relation Age of Onset  . Alcohol abuse Neg Hx   . Arthritis Neg Hx   . Asthma Neg Hx   . Birth defects Neg Hx   . Cancer Neg Hx   . COPD Neg Hx   . Depression Neg Hx   . Diabetes Neg Hx   . Drug abuse Neg Hx   . Early death Neg Hx   . Hearing loss Neg Hx   . Heart disease Neg Hx   . Hyperlipidemia Neg Hx   . Hypertension Neg Hx   . Kidney disease Neg Hx   . Learning disabilities Neg Hx   . Mental illness Neg Hx   . Mental retardation Neg Hx   . Miscarriages / Stillbirths Neg Hx   . Stroke Neg Hx   . Vision loss Neg Hx   . Varicose Veins Neg Hx    Social History  Substance Use Topics  . Smoking status: Current Every Day Smoker    Types: Cigars  . Smokeless tobacco: Never Used  . Alcohol Use: Yes   OB History    Gravida Para Term Preterm AB TAB SAB Ectopic Multiple Living    2    2 2          Review of Systems  Constitutional: Negative for activity change.  Musculoskeletal: Positive for arthralgias and gait problem. Negative for back pain, joint swelling and neck pain.  Skin: Positive for wound.  Neurological: Negative for weakness and numbness.      Allergies  Review of patient's allergies indicates no known allergies.  Home Medications   Prior to Admission medications   Medication Sig Start Date End Date Taking? Authorizing Provider  dextromethorphan (DELSYM) 30 MG/5ML liquid Take 5 mLs (30 mg total) by mouth as needed for cough. Patient not taking: Reported on 06/08/2015 09/01/14   Garlon HatchetLisa M Sanders, PA-C  fluconazole (DIFLUCAN) 150 MG tablet Take 1 tablet (150 mg total) by mouth daily. Take one pill today and the second pill on 3/19 Patient not taking: Reported on 09/02/2014 08/05/14   Duane LopeJennifer I Rasch, NP  ibuprofen (ADVIL,MOTRIN) 600 MG tablet Take 1 tablet (600 mg total) by mouth every 6 (six) hours as needed. 06/08/15   Shon Batonourtney F Horton, MD  ondansetron (ZOFRAN ODT) 4 MG disintegrating  tablet Take 1 tablet (4 mg total) by mouth every 8 (eight) hours as needed for nausea or vomiting. 06/08/15   Shon Baton, MD  oseltamivir (TAMIFLU) 75 MG capsule Take 1 capsule (75 mg total) by mouth every 12 (twelve) hours. Patient not taking: Reported on 06/08/2015 09/01/14   Garlon Hatchet, PA-C   BP 121/81 mmHg  Pulse 88  Temp(Src) 98.7 F (37.1 C) (Oral)  Resp 17  SpO2 100%  LMP 07/07/2015 Physical Exam  Constitutional: She appears well-developed and well-nourished.  HENT:  Head: Normocephalic and atraumatic.  Eyes: Pupils are equal, round, and reactive to light.  Neck: Normal range of motion. Neck supple.  Cardiovascular: Exam reveals no decreased pulses.   Pulses:      Dorsalis pedis pulses are 2+ on the right side, and 2+ on the left side.  Musculoskeletal: She exhibits tenderness. She exhibits no edema.       Right ankle: Normal.       Right  foot: There is tenderness. There is normal range of motion and no bony tenderness.       Feet:  Neurological: She is alert. No sensory deficit.  Motor, sensation, and vascular distal to the injury is fully intact.   Skin: Skin is warm and dry.  Psychiatric: She has a normal mood and affect.  Nursing note and vitals reviewed.   ED Course  Procedures (including critical care time) Labs Review Labs Reviewed - No data to display  Imaging Review Dg Foot Complete Right  07/25/2015  CLINICAL DATA:  Patient stepped on a nail last night; puncture wound and pain near distal 1st metatarsal EXAM: RIGHT FOOT COMPLETE - 3+ VIEW COMPARISON:  None. FINDINGS: There is no evidence of fracture or dislocation. There is no evidence of arthropathy or other focal bone abnormality. Soft tissues are unremarkable. IMPRESSION: Negative. Electronically Signed   By: Esperanza Heir M.D.   On: 07/25/2015 11:55   I have personally reviewed and evaluated these images and lab results as part of my medical decision-making.   EKG Interpretation None       11:51 AM Patient seen and examined. Medications ordered.   Vital signs reviewed and are as follows: BP 121/81 mmHg  Pulse 88  Temp(Src) 98.7 F (37.1 C) (Oral)  Resp 17  SpO2 100%  LMP 07/07/2015  Patient provided with crutches to assist with ambulation.  Pt urged to return with worsening pain, worsening swelling, expanding area of redness or streaking up extremity, fever, or any other concerns. Urged to take complete course of antibiotics as prescribed. Counseled to take pain medications as prescribed. Pt verbalizes understanding and agrees with plan.  Patient counseled on use of narcotic pain medications. Counseled not to combine these medications with others containing tylenol. Urged not to drink alcohol, drive, or perform any other activities that requires focus while taking these medications. The patient verbalizes understanding and agrees with the  plan.   MDM   Final diagnoses:  Puncture wound of foot, right, initial encounter   Puncture wound -- x-ray negative. Patient does have significant tenderness and cannot bear weight. Crutches given. No evidence of infection at this point. Given location and pain, will provide with prophylactic antibiotics for 5 days and pain medicine given. Return instructions as above.   Renne Crigler, PA-C 07/25/15 1230  Rolland Porter, MD 08/02/15 [phone]

## 2015-07-25 NOTE — Discharge Instructions (Signed)
Please read and follow all provided instructions.  Your diagnoses today include:  1. Puncture wound of foot, right, initial encounter    Tests performed today include:  Vital signs. See below for your results today.   Medications prescribed:   Vicodin (hydrocodone/acetaminophen) - narcotic pain medication  DO NOT drive or perform any activities that require you to be awake and alert because this medicine can make you drowsy. BE VERY CAREFUL not to take multiple medicines containing Tylenol (also called acetaminophen). Doing so can lead to an overdose which can damage your liver and cause liver failure and possibly death.   Keflex (cephalexin) - antibiotic  You have been prescribed an antibiotic medicine: take the entire course of medicine even if you are feeling better. Stopping early can cause the antibiotic not to work.  Take any prescribed medications only as directed.   Home care instructions:  Follow any educational materials contained in this packet. Keep affected area above the level of your heart when possible. Wash area gently twice a day with warm soapy water. Do not apply alcohol or hydrogen peroxide. Cover the area if it draining or weeping.   Follow-up instructions: Please follow-up with your primary care provider in the next 1 week for further evaluation of your symptoms.   Return instructions:  Return to the Emergency Department if you have:  Fever  Worsening symptoms  Worsening pain  Worsening swelling  Redness of the skin that moves away from the affected area, especially if it streaks away from the affected area   Any other emergent concerns  Your vital signs today were: BP 121/81 mmHg   Pulse 88   Temp(Src) 98.7 F (37.1 C) (Oral)   Resp 17   SpO2 100%   LMP 07/07/2015 If your blood pressure (BP) was elevated above 135/85 this visit, please have this repeated by your doctor within one month. --------------

## 2015-07-25 NOTE — ED Notes (Signed)
Pt reports stepping on a nail last night with her R bare foot.  Swelling noted.  Non-weight bearing.

## 2016-06-01 ENCOUNTER — Encounter (HOSPITAL_COMMUNITY): Payer: Self-pay

## 2016-06-01 ENCOUNTER — Emergency Department (HOSPITAL_COMMUNITY)
Admission: EM | Admit: 2016-06-01 | Discharge: 2016-06-02 | Disposition: A | Discharge status: Home / Self Care | Payer: Self-pay | Attending: Emergency Medicine | Admitting: Emergency Medicine

## 2016-06-01 DIAGNOSIS — Z79899 Other long term (current) drug therapy: Secondary | ICD-10-CM | POA: Insufficient documentation

## 2016-06-01 DIAGNOSIS — A084 Viral intestinal infection, unspecified: Secondary | ICD-10-CM | POA: Insufficient documentation

## 2016-06-01 DIAGNOSIS — J45909 Unspecified asthma, uncomplicated: Secondary | ICD-10-CM | POA: Insufficient documentation

## 2016-06-01 DIAGNOSIS — F17290 Nicotine dependence, other tobacco product, uncomplicated: Secondary | ICD-10-CM | POA: Insufficient documentation

## 2016-06-01 LAB — URINALYSIS, ROUTINE W REFLEX MICROSCOPIC
BILIRUBIN URINE: NEGATIVE
Glucose, UA: NEGATIVE mg/dL
Hgb urine dipstick: NEGATIVE
KETONES UR: 80 mg/dL — AB
LEUKOCYTES UA: NEGATIVE
NITRITE: NEGATIVE
PH: 5 (ref 5.0–8.0)
Protein, ur: NEGATIVE mg/dL
SPECIFIC GRAVITY, URINE: 1.03 (ref 1.005–1.030)

## 2016-06-01 LAB — COMPREHENSIVE METABOLIC PANEL
ALK PHOS: 77 U/L (ref 38–126)
ALT: 19 U/L (ref 14–54)
AST: 18 U/L (ref 15–41)
Albumin: 4.3 g/dL (ref 3.5–5.0)
Anion gap: 10 (ref 5–15)
BILIRUBIN TOTAL: 0.5 mg/dL (ref 0.3–1.2)
BUN: 11 mg/dL (ref 6–20)
CALCIUM: 8.9 mg/dL (ref 8.9–10.3)
CO2: 22 mmol/L (ref 22–32)
Chloride: 105 mmol/L (ref 101–111)
Creatinine, Ser: 0.63 mg/dL (ref 0.44–1.00)
Glucose, Bld: 85 mg/dL (ref 65–99)
POTASSIUM: 4.1 mmol/L (ref 3.5–5.1)
Sodium: 137 mmol/L (ref 135–145)
TOTAL PROTEIN: 7.5 g/dL (ref 6.5–8.1)

## 2016-06-01 LAB — LIPASE, BLOOD: Lipase: 14 U/L (ref 11–51)

## 2016-06-01 LAB — CBC
HEMATOCRIT: 43.3 % (ref 36.0–46.0)
HEMOGLOBIN: 14.5 g/dL (ref 12.0–15.0)
MCH: 28.8 pg (ref 26.0–34.0)
MCHC: 33.5 g/dL (ref 30.0–36.0)
MCV: 86.1 fL (ref 78.0–100.0)
Platelets: 179 10*3/uL (ref 150–400)
RBC: 5.03 MIL/uL (ref 3.87–5.11)
RDW: 14.7 % (ref 11.5–15.5)
WBC: 6.6 10*3/uL (ref 4.0–10.5)

## 2016-06-01 LAB — HCG, QUANTITATIVE, PREGNANCY

## 2016-06-01 MED ORDER — SODIUM CHLORIDE 0.9 % IV BOLUS (SEPSIS)
2000.0000 mL | Freq: Once | INTRAVENOUS | Status: AC
  Administered 2016-06-01: 2000 mL via INTRAVENOUS

## 2016-06-01 MED ORDER — KETOROLAC TROMETHAMINE 30 MG/ML IJ SOLN
30.0000 mg | Freq: Once | INTRAMUSCULAR | Status: AC
  Administered 2016-06-01: 30 mg via INTRAVENOUS
  Filled 2016-06-01: qty 1

## 2016-06-01 MED ORDER — PROMETHAZINE HCL 25 MG PO TABS: 25.0000 mg | ORAL_TABLET | Freq: Four times a day (QID) | ORAL | 0 refills | Status: DC | PRN

## 2016-06-01 MED ORDER — ONDANSETRON HCL 4 MG/2ML IJ SOLN
4.0000 mg | Freq: Once | INTRAMUSCULAR | Status: AC
  Administered 2016-06-01: 4 mg via INTRAVENOUS
  Filled 2016-06-01: qty 2

## 2016-06-01 MED ORDER — ONDANSETRON 4 MG PO TBDP
4.0000 mg | ORAL_TABLET | Freq: Once | ORAL | Status: AC | PRN
  Administered 2016-06-01: 4 mg via ORAL
  Filled 2016-06-01: qty 1

## 2016-06-01 NOTE — ED Triage Notes (Signed)
Patient reports having N/V/D and upper abdominal pain since 0600 today. Patient denies any blood in emesis or stool

## 2016-06-01 NOTE — Discharge Instructions (Signed)
Please read and follow all provided instructions.  Your diagnoses today include:  1. Viral gastroenteritis     Tests performed today include: Blood counts and electrolytes Blood tests to check liver and kidney function Blood tests to check pancreas function Urine test to look for infection and pregnancy (in women) Vital signs. See below for your results today.   Medications prescribed:   Take any prescribed medications only as directed.  Home care instructions:  Follow any educational materials contained in this packet.  Follow-up instructions: Please follow-up with your primary care provider in the next 2 days for further evaluation of your symptoms.    Return instructions:  SEEK IMMEDIATE MEDICAL ATTENTION IF: The pain does not go away or becomes severe  A temperature above 101F develops  Repeated vomiting occurs (multiple episodes)  The pain becomes localized to portions of the abdomen. The right side could possibly be appendicitis. In an adult, the left lower portion of the abdomen could be colitis or diverticulitis.  Blood is being passed in stools or vomit (bright red or black tarry stools)  You develop chest pain, difficulty breathing, dizziness or fainting, or become confused, poorly responsive, or inconsolable (young children) If you have any other emergent concerns regarding your health  Additional Information: Abdominal (belly) pain can be caused by many things. Your caregiver performed an examination and possibly ordered blood/urine tests and imaging (CT scan, x-rays, ultrasound). Many cases can be observed and treated at home after initial evaluation in the emergency department. Even though you are being discharged home, abdominal pain can be unpredictable. Therefore, you need a repeated exam if your pain does not resolve, returns, or worsens. Most patients with abdominal pain don't have to be admitted to the hospital or have surgery, but serious problems like  appendicitis and gallbladder attacks can start out as nonspecific pain. Many abdominal conditions cannot be diagnosed in one visit, so follow-up evaluations are very important.  Your vital signs today were: BP 107/70 (BP Location: Left Arm)    Pulse (!) 59    Temp 99.2 F (37.3 C) (Oral)    Resp 16    Ht 5\' 4"  (1.626 m)    Wt 74.8 kg    LMP 05/13/2016    SpO2 100%    BMI 28.32 kg/m  If your blood pressure (bp) was elevated above 135/85 this visit, please have this repeated by your doctor within one month. --------------

## 2016-06-01 NOTE — ED Provider Notes (Signed)
[facility]-EMERGENCY DEPT Provider Note   CSN: [id number] Arrival date & time: 06/01/16  1319 By signing my name below, I, April HedgerElizabeth Fischer, attest that this documentation has been prepared under the direction and in the presence of non-physician practitioner, Audry Piliyler Julane Crock, PA-C. Electronically Signed: Levon HedgerElizabeth Fischer, Scribe. 06/01/2016. 9:30 PM.   History   Chief Complaint Chief Complaint  Patient presents with  . Emesis  . Diarrhea  . Abdominal Pain    HPI April Fischer is an otherwise healthy 26 y.o. female who presents to the Emergency Department complaining of intermittent, moderate vomiting onset this morning at 6 am. She notes associated diarrhea, nausea, and upper abdominal pain. No alleviating or modifying factors noted. No prior treatments reported. Pt is not on any chronic medications. She denies any suspicious food intake. No recent sick contacts.  She denies any hematemesis or hematochezia, dysuria, CP, SOB, fever, or any other associated symptoms at this time.   The history is provided by the patient. No language interpreter was used.   Past Medical History:  Diagnosis Date  . Asthma   . Headache    There are no active problems to display for this patient.  Past Surgical History:  Procedure Laterality Date  . DILATION AND CURETTAGE OF UTERUS    . INDUCED ABORTION     OB History    Gravida Para Term Preterm AB Living   2       2     SAB TAB Ectopic Multiple Live Births     2            Home Medications    Prior to Admission medications   Medication Sig Start Date End Date Taking? Authorizing Provider  cephALEXin (KEFLEX) 500 MG capsule Take 1 capsule (500 mg total) by mouth 4 (four) times daily. 07/25/15   Renne CriglerJoshua Geiple, PA-C  HYDROcodone-acetaminophen (NORCO/VICODIN) 5-325 MG tablet Take 1-2 tablets every 6 hours as needed for severe pain 07/25/15   Renne CriglerJoshua Geiple, PA-C  ibuprofen (ADVIL,MOTRIN) 600 MG tablet Take 1 tablet (600 mg total) by mouth every 6 (six) hours as  needed. 06/08/15   Shon Batonourtney F Horton, MD  ondansetron (ZOFRAN ODT) 4 MG disintegrating tablet Take 1 tablet (4 mg total) by mouth every 8 (eight) hours as needed for nausea or vomiting. 06/08/15   Shon Batonourtney F Horton, MD    Family History Family History  Problem Relation Age of Onset  . Alcohol abuse Neg Hx   . Arthritis Neg Hx   . Asthma Neg Hx   . Birth defects Neg Hx   . Cancer Neg Hx   . COPD Neg Hx   . Depression Neg Hx   . Diabetes Neg Hx   . Drug abuse Neg Hx   . Early death Neg Hx   . Hearing loss Neg Hx   . Heart disease Neg Hx   . Hyperlipidemia Neg Hx   . Hypertension Neg Hx   . Kidney disease Neg Hx   . Learning disabilities Neg Hx   . Mental illness Neg Hx   . Mental retardation Neg Hx   . Miscarriages / Stillbirths Neg Hx   . Stroke Neg Hx   . Vision loss Neg Hx   . Varicose Veins Neg Hx     Social History Social History  Substance Use Topics  . Smoking status: Current Every Day Smoker    Types: Cigars  . Smokeless tobacco: Never Used  . Alcohol use Yes     Comment:  ocasionally     Allergies   Patient has no known allergies.   Review of Systems Review of Systems 10 systems reviewed and all are negative for acute change except as noted in the HPI.  Physical Exam Updated Vital Signs BP 107/58 (BP Location: Right Arm)   Pulse 78   Temp 99.2 F (37.3 C) (Oral)   Resp 20   Ht 5\' 4"  (1.626 m)   Wt 165 lb (74.8 kg)   LMP 05/13/2016   SpO2 100%   BMI 28.32 kg/m   Physical Exam  Constitutional: She is oriented to person, place, and time. Vital signs are normal. She appears well-developed and well-nourished. No distress.  HENT:  Head: Normocephalic and atraumatic.  Right Ear: Hearing normal.  Left Ear: Hearing normal.  Eyes: Conjunctivae and EOM are normal. Pupils are equal, round, and reactive to light.  Neck: Normal range of motion. Neck supple.  Cardiovascular: Normal rate, regular rhythm, normal heart sounds and intact distal pulses.     Pulmonary/Chest: Effort normal and breath sounds normal. She has no decreased breath sounds.  Abdominal: Normal appearance and bowel sounds are normal. She exhibits no distension. There is tenderness in the epigastric area. There is no rigidity, no rebound, no guarding, no CVA tenderness, no tenderness at McBurney's point and negative Murphy's sign.  Musculoskeletal: Normal range of motion.  Neurological: She is alert and oriented to person, place, and time.  Skin: Skin is warm and dry.  Psychiatric: She has a normal mood and affect. Her speech is normal and behavior is normal. Thought content normal.  Nursing note and vitals reviewed.  ED Treatments / Results  DIAGNOSTIC STUDIES:  Oxygen Saturation is 100% on RA, normal by my interpretation.    COORDINATION OF CARE:  9:27 PM Discussed treatment plan with pt at bedside and pt agreed to plan.   Labs (all labs ordered are listed, but only abnormal results are displayed) Labs Reviewed  URINALYSIS, ROUTINE W REFLEX MICROSCOPIC - Abnormal; Notable for the following:       Result Value   APPearance CLOUDY (*)    Ketones, ur 80 (*)    All other components within normal limits  LIPASE, BLOOD  COMPREHENSIVE METABOLIC PANEL  CBC  HCG, QUANTITATIVE, PREGNANCY   EKG  EKG Interpretation None      Radiology No results found.  Procedures Procedures (including critical care time)  Medications Ordered in ED Medications  ondansetron (ZOFRAN-ODT) disintegrating tablet 4 mg (4 mg Oral Given 06/01/16 1351)     Initial Impression / Assessment and Plan / ED Course  I have reviewed the triage vital signs and the nursing notes.  Pertinent labs & imaging results that were available during my care of the patient were reviewed by me and considered in my medical decision making (see chart for details).  Clinical Course    Final Clinical Impressions(s) / ED Diagnoses  {I have reviewed and evaluated the relevant laboratory values.   {I have  reviewed the relevant previous healthcare records.  {I obtained HPI from historian.   ED Course:  Assessment: Patient is a 25yF presents with abdominal pain x since 0600. N/V/D. Pain is epigastric. On exam, nontoxic, nonseptic appearing, in no apparent distress. Patient's pain and other symptoms adequately managed in emergency department.  Fluid bolus given.  Labs and vitals reviewed.  Patient does not meet the SIRS or Sepsis criteria.  On repeat exam patient does not have a surgical abdomen and there are no peritoneal signs.  No indication of appendicitis, bowel obstruction, bowel perforation, cholecystitis, diverticulitis, PID or ectopic pregnancy. Likely viral gastroenteritis. Improved with fluids and antiemetics. Patient discharged home with symptomatic treatment and given strict instructions for follow-up with their primary care physician.  I have also discussed reasons to return immediately to the ER.  Patient expresses understanding and agrees with plan.  Disposition/Plan:  DC Home Additional Verbal discharge instructions given and discussed with patient.  Pt Instructed to f/u with PCP in the next week for evaluation and treatment of symptoms. Return precautions given Pt acknowledges and agrees with plan  Supervising Physician Lyndal Pulley, MD   Final diagnoses:  Viral gastroenteritis    New Prescriptions New Prescriptions   No medications on file   I personally performed the services described in this documentation, which was scribed in my presence. The recorded information has been reviewed and is accurate.    Audry Pili, PA-C 06/01/16 2321    Lyndal Pulley, MD 06/02/16 [phone]

## 2018-01-23 DIAGNOSIS — R7989 Other specified abnormal findings of blood chemistry: Secondary | ICD-10-CM | POA: Insufficient documentation

## 2018-01-23 DIAGNOSIS — F17210 Nicotine dependence, cigarettes, uncomplicated: Secondary | ICD-10-CM | POA: Insufficient documentation

## 2018-01-23 DIAGNOSIS — N926 Irregular menstruation, unspecified: Secondary | ICD-10-CM

## 2018-01-23 DIAGNOSIS — L680 Hirsutism: Secondary | ICD-10-CM | POA: Insufficient documentation

## 2018-01-23 HISTORY — DX: Hirsutism: L68.0

## 2018-01-23 HISTORY — DX: Other specified abnormal findings of blood chemistry: R79.89

## 2018-01-23 HISTORY — DX: Nicotine dependence, cigarettes, uncomplicated: F17.210

## 2018-01-23 HISTORY — DX: Irregular menstruation, unspecified: N92.6

## 2018-05-26 ENCOUNTER — Emergency Department (HOSPITAL_COMMUNITY)
Admission: EM | Admit: 2018-05-26 | Discharge: 2018-05-26 | Disposition: A | Discharge status: Home / Self Care | Payer: BLUE CROSS/BLUE SHIELD | Attending: Emergency Medicine | Admitting: Emergency Medicine

## 2018-05-26 ENCOUNTER — Encounter (HOSPITAL_COMMUNITY): Payer: Self-pay

## 2018-05-26 DIAGNOSIS — R112 Nausea with vomiting, unspecified: Secondary | ICD-10-CM | POA: Diagnosis present

## 2018-05-26 LAB — URINALYSIS, ROUTINE W REFLEX MICROSCOPIC
BACTERIA UA: NONE SEEN
Bilirubin Urine: NEGATIVE
Glucose, UA: NEGATIVE mg/dL
HGB URINE DIPSTICK: NEGATIVE
Ketones, ur: 5 mg/dL — AB
Leukocytes, UA: NEGATIVE
NITRITE: NEGATIVE
PROTEIN: 30 mg/dL — AB
SPECIFIC GRAVITY, URINE: 1.03 (ref 1.005–1.030)
pH: 5 (ref 5.0–8.0)

## 2018-05-26 LAB — CBC WITH DIFFERENTIAL/PLATELET
Abs Immature Granulocytes: 0.02 10*3/uL (ref 0.00–0.07)
Basophils Absolute: 0 10*3/uL (ref 0.0–0.1)
Basophils Relative: 0 %
EOS ABS: 0.1 10*3/uL (ref 0.0–0.5)
EOS PCT: 1 %
HEMATOCRIT: 45.8 % (ref 36.0–46.0)
HEMOGLOBIN: 14.3 g/dL (ref 12.0–15.0)
Immature Granulocytes: 0 %
LYMPHS ABS: 4 10*3/uL (ref 0.7–4.0)
LYMPHS PCT: 41 %
MCH: 27.9 pg (ref 26.0–34.0)
MCHC: 31.2 g/dL (ref 30.0–36.0)
MCV: 89.3 fL (ref 80.0–100.0)
MONOS PCT: 6 %
Monocytes Absolute: 0.6 10*3/uL (ref 0.1–1.0)
Neutro Abs: 4.9 10*3/uL (ref 1.7–7.7)
Neutrophils Relative %: 52 %
Platelets: 252 10*3/uL (ref 150–400)
RBC: 5.13 MIL/uL — ABNORMAL HIGH (ref 3.87–5.11)
RDW: 14.5 % (ref 11.5–15.5)
WBC: 9.6 10*3/uL (ref 4.0–10.5)
nRBC: 0 % (ref 0.0–0.2)

## 2018-05-26 LAB — COMPREHENSIVE METABOLIC PANEL
ALK PHOS: 66 U/L (ref 38–126)
ALT: 12 U/L (ref 0–44)
AST: 20 U/L (ref 15–41)
Albumin: 4.1 g/dL (ref 3.5–5.0)
Anion gap: 7 (ref 5–15)
BILIRUBIN TOTAL: 0.6 mg/dL (ref 0.3–1.2)
BUN: 8 mg/dL (ref 6–20)
CALCIUM: 9.3 mg/dL (ref 8.9–10.3)
CO2: 24 mmol/L (ref 22–32)
CREATININE: 0.84 mg/dL (ref 0.44–1.00)
Chloride: 106 mmol/L (ref 98–111)
GLUCOSE: 87 mg/dL (ref 70–99)
Potassium: 4.3 mmol/L (ref 3.5–5.1)
Sodium: 137 mmol/L (ref 135–145)
Total Protein: 7.4 g/dL (ref 6.5–8.1)

## 2018-05-26 LAB — I-STAT BETA HCG BLOOD, ED (MC, WL, AP ONLY)

## 2018-05-26 LAB — LIPASE, BLOOD: Lipase: 23 U/L (ref 11–51)

## 2018-05-26 MED ORDER — ONDANSETRON HCL 4 MG/2ML IJ SOLN
4.0000 mg | Freq: Once | INTRAMUSCULAR | Status: AC
  Administered 2018-05-26: 4 mg via INTRAVENOUS
  Filled 2018-05-26: qty 2

## 2018-05-26 MED ORDER — SODIUM CHLORIDE 0.9 % IV SOLN
1000.0000 mL | INTRAVENOUS | Status: DC
  Administered 2018-05-26: 1000 mL via INTRAVENOUS

## 2018-05-26 MED ORDER — FAMOTIDINE IN NACL 20-0.9 MG/50ML-% IV SOLN
20.0000 mg | Freq: Once | INTRAVENOUS | Status: AC
  Administered 2018-05-26: 20 mg via INTRAVENOUS
  Filled 2018-05-26: qty 50

## 2018-05-26 MED ORDER — SODIUM CHLORIDE 0.9 % IV BOLUS (SEPSIS)
1000.0000 mL | Freq: Once | INTRAVENOUS | Status: AC
  Administered 2018-05-26: 1000 mL via INTRAVENOUS

## 2018-05-26 MED ORDER — ONDANSETRON 4 MG PO TBDP: 4.0000 mg | ORAL_TABLET | ORAL | 0 refills | Status: DC | PRN

## 2018-05-26 NOTE — Discharge Instructions (Signed)
1.  Follow-up with your family doctor if you have any recurrence of symptoms. 2.  Return to the emergency department if you develop fever, abdominal pain or worsening symptoms.

## 2018-05-26 NOTE — ED Provider Notes (Signed)
[facility] EMERGENCY DEPARTMENT Provider Note   CSN: [id number] Arrival date & time: 05/26/18  1924     History   Chief Complaint Chief Complaint  Patient presents with  . Emesis    HPI April Fischer is a 28 y.o. female.  HPI Patient reports multiple episodes of vomiting since last night.  She reports she went out with friends and had 2 shots but did not drink an excessive amount.  She does not think that vomiting is due to alcohol consumption.  She has general nausea but not severe abdominal pain.  She reports she has not developed any diarrhea.  She has not had fever or myalgia.  No one else became ill.  She reports she ate [employer] yesterday.  She reports she has not been able to stop vomiting all day now. Past Medical History:  Diagnosis Date  . Asthma   . Headache     There are no active problems to display for this patient.   Past Surgical History:  Procedure Laterality Date  . DILATION AND CURETTAGE OF UTERUS    . INDUCED ABORTION       OB History    Gravida  2   Para      Term      Preterm      AB  2   Living        SAB      TAB  2   Ectopic      Multiple      Live Births               Home Medications    Prior to Admission medications   Medication Sig Start Date End Date Taking? Authorizing Provider  acetaminophen (TYLENOL) 325 MG tablet Take 650 mg by mouth every 6 (six) hours as needed.    [provider]  ondansetron (ZOFRAN ODT) 4 MG disintegrating tablet Take 1 tablet (4 mg total) by mouth every 4 (four) hours as needed for nausea or vomiting. 05/26/18   Arby Barrette, MD  promethazine (PHENERGAN) 25 MG tablet Take 1 tablet (25 mg total) by mouth every 6 (six) hours as needed for nausea or vomiting. 06/01/16   Audry Pili, PA-C    Family History Family History  Problem Relation Age of Onset  . Alcohol abuse Neg Hx   . Arthritis Neg Hx   . Asthma Neg Hx   . Birth defects Neg Hx   . Cancer Neg  Hx   . COPD Neg Hx   . Depression Neg Hx   . Diabetes Neg Hx   . Drug abuse Neg Hx   . Early death Neg Hx   . Hearing loss Neg Hx   . Heart disease Neg Hx   . Hyperlipidemia Neg Hx   . Hypertension Neg Hx   . Kidney disease Neg Hx   . Learning disabilities Neg Hx   . Mental illness Neg Hx   . Mental retardation Neg Hx   . Miscarriages / Stillbirths Neg Hx   . Stroke Neg Hx   . Vision loss Neg Hx   . Varicose Veins Neg Hx     Social History Social History   Tobacco Use  . Smoking status: Current Every Day Smoker    Types: Cigars  . Smokeless tobacco: Never Used  Substance Use Topics  . Alcohol use: Yes    Comment: ocasionally  . Drug use: Yes    Types: Marijuana  Comment: daily     Allergies   Patient has no known allergies.   Review of Systems Review of Systems 10 Systems reviewed and are negative for acute change except as noted in the HPI.   Physical Exam Updated Vital Signs BP 110/68 (BP Location: Left Arm)   Pulse 70   Temp 98.1 F (36.7 C) (Oral)   Resp 16   LMP 06/14/2017   SpO2 98%   Physical Exam Constitutional:      Appearance: She is well-developed.  HENT:     Head: Normocephalic and atraumatic.  Eyes:     Pupils: Pupils are equal, round, and reactive to light.  Neck:     Musculoskeletal: Neck supple.  Cardiovascular:     Rate and Rhythm: Normal rate and regular rhythm.     Heart sounds: Normal heart sounds.  Pulmonary:     Effort: Pulmonary effort is normal.     Breath sounds: Normal breath sounds.  Abdominal:     General: Bowel sounds are normal. There is no distension.     Palpations: Abdomen is soft.     Tenderness: There is no abdominal tenderness.  Musculoskeletal: Normal range of motion.  Skin:    General: Skin is warm and dry.  Neurological:     Mental Status: She is alert and oriented to person, place, and time.     GCS: GCS eye subscore is 4. GCS verbal subscore is 5. GCS motor subscore is 6.     Coordination:  Coordination normal.      ED Treatments / Results  Labs (all labs ordered are listed, but only abnormal results are displayed) Labs Reviewed  CBC WITH DIFFERENTIAL/PLATELET - Abnormal; Notable for the following components:      Result Value   RBC 5.13 (*)    All other components within normal limits  URINALYSIS, ROUTINE W REFLEX MICROSCOPIC - Abnormal; Notable for the following components:   APPearance HAZY (*)    Ketones, ur 5 (*)    Protein, ur 30 (*)    All other components within normal limits  COMPREHENSIVE METABOLIC PANEL  LIPASE, BLOOD  I-STAT BETA HCG BLOOD, ED ([facility], [facility], [facility] ONLY)    EKG None  Radiology No results found.  Procedures Procedures (including critical care time)  Medications Ordered in ED Medications  sodium chloride 0.9 % bolus 1,000 mL (1,000 mLs Intravenous New Bag/Given 05/26/18 2108)    Followed by  sodium chloride 0.9 % bolus 1,000 mL (1,000 mLs Intravenous New Bag/Given 05/26/18 2202)    Followed by  0.9 %  sodium chloride infusion (1,000 mLs Intravenous New Bag/Given 05/26/18 2106)  ondansetron (ZOFRAN) injection 4 mg (4 mg Intravenous Given 05/26/18 2107)  famotidine (PEPCID) IVPB 20 mg premix (0 mg Intravenous Stopped 05/26/18 2203)     Initial Impression / Assessment and Plan / ED Course  I have reviewed the triage vital signs and the nursing notes.  Pertinent labs & imaging results that were available during my care of the patient were reviewed by me and considered in my medical decision making (see chart for details).    Symptoms have resolved with rehydration and Zofran.  Patient does not have abdominal pain or fever.  She is up and ambulatory and comfortable in appearance.  Possible viral gastritis\food related\alcohol related.  Return precautions reviewed.  Final Clinical Impressions(s) / ED Diagnoses   Final diagnoses:  Non-intractable vomiting with nausea, unspecified vomiting type    ED Discharge Orders  Ordered     ondansetron (ZOFRAN ODT) 4 MG disintegrating tablet  Every 4 hours PRN     05/26/18 2251           Arby Barrette, MD 05/26/18 2252

## 2018-05-26 NOTE — ED Triage Notes (Signed)
Onset this morning vomiting x 15, last vomit 15 minutes ago.  No one in household sick.  Pt went out last night, drank alcohol but no more than she usually does.  No other s/s.

## 2019-02-02 DIAGNOSIS — Z8742 Personal history of other diseases of the female genital tract: Secondary | ICD-10-CM | POA: Insufficient documentation

## 2019-02-02 HISTORY — DX: Personal history of other diseases of the female genital tract: Z87.42

## 2019-10-15 ENCOUNTER — Inpatient Hospital Stay (HOSPITAL_COMMUNITY)
Admission: AD | Admit: 2019-10-15 | Discharge: 2019-10-15 | Disposition: A | Discharge status: Home / Self Care | Payer: BLUE CROSS/BLUE SHIELD | Attending: Family Medicine | Admitting: Family Medicine

## 2019-10-15 ENCOUNTER — Encounter (HOSPITAL_COMMUNITY): Payer: Self-pay | Admitting: Family Medicine

## 2019-10-15 DIAGNOSIS — O99331 Smoking (tobacco) complicating pregnancy, first trimester: Secondary | ICD-10-CM | POA: Insufficient documentation

## 2019-10-15 DIAGNOSIS — Z332 Encounter for elective termination of pregnancy: Secondary | ICD-10-CM

## 2019-10-15 DIAGNOSIS — O26891 Other specified pregnancy related conditions, first trimester: Secondary | ICD-10-CM | POA: Insufficient documentation

## 2019-10-15 DIAGNOSIS — F17290 Nicotine dependence, other tobacco product, uncomplicated: Secondary | ICD-10-CM | POA: Insufficient documentation

## 2019-10-15 DIAGNOSIS — Z3A01 Less than 8 weeks gestation of pregnancy: Secondary | ICD-10-CM | POA: Insufficient documentation

## 2019-10-15 DIAGNOSIS — Z79899 Other long term (current) drug therapy: Secondary | ICD-10-CM | POA: Insufficient documentation

## 2019-10-15 DIAGNOSIS — J45909 Unspecified asthma, uncomplicated: Secondary | ICD-10-CM | POA: Insufficient documentation

## 2019-10-15 DIAGNOSIS — R102 Pelvic and perineal pain: Secondary | ICD-10-CM

## 2019-10-15 DIAGNOSIS — O99511 Diseases of the respiratory system complicating pregnancy, first trimester: Secondary | ICD-10-CM | POA: Insufficient documentation

## 2019-10-15 MED ORDER — OXYCODONE-ACETAMINOPHEN 5-325 MG PO TABS: 1.0000 | ORAL_TABLET | Freq: Four times a day (QID) | ORAL | 0 refills | Status: AC | PRN

## 2019-10-15 MED ORDER — IBUPROFEN 800 MG PO TABS: 800.0000 mg | ORAL_TABLET | Freq: Three times a day (TID) | ORAL | 0 refills | Status: DC | PRN

## 2019-10-15 MED ORDER — HYDROMORPHONE HCL 1 MG/ML IJ SOLN
1.0000 mg | Freq: Once | INTRAMUSCULAR | Status: AC
  Administered 2019-10-15: 1 mg via INTRAVENOUS
  Filled 2019-10-15: qty 1

## 2019-10-15 MED ORDER — ONDANSETRON 8 MG PO TBDP: 8.0000 mg | ORAL_TABLET | Freq: Three times a day (TID) | ORAL | 0 refills | Status: DC | PRN

## 2019-10-15 MED ORDER — KETOROLAC TROMETHAMINE 30 MG/ML IJ SOLN
30.0000 mg | Freq: Once | INTRAMUSCULAR | Status: AC
  Administered 2019-10-15: 30 mg via INTRAVENOUS
  Filled 2019-10-15: qty 1

## 2019-10-15 MED ORDER — LACTATED RINGERS IV BOLUS
1000.0000 mL | Freq: Once | INTRAVENOUS | Status: AC
  Administered 2019-10-15: 1000 mL via INTRAVENOUS

## 2019-10-15 NOTE — MAU Note (Signed)
Took vaginal cytotec at 2230 last night, cramping started at 0000 and has become unbearable.  Pain was constant until 0200, now occurring intermittently.  Also having episodes of vomiting.  Patient states she was approximately [redacted] weeks pregnant.

## 2019-10-15 NOTE — MAU Provider Note (Signed)
History     CSN: [id number]  Arrival date and time: 10/15/19 4098   First Provider Initiated Contact with Patient 10/15/19 [phone]      Chief Complaint  Patient presents with  . Pelvic Pain   April Fischer is a 29 y.o. G2P0020 at Unknown who presents today via EMS with severe cramping. She reports that around 2230 she took vaginal cytotec for a pregnancy termination. Around 0000 she had constant cramping and nausea/vomiting. The around 0230 the cramping became more intermittent, but she was still having nausea and vomiting. She reports a small amount of vaginal bleeding at this time. EMS gave her a dose of Zofran prior to arrival here.   Pelvic Pain The patient's primary symptoms include pelvic pain. This is a new problem. The current episode started today. The problem occurs constantly. The problem has been gradually worsening. The problem affects both sides. She is pregnant. Associated symptoms include nausea and vomiting. Pertinent negatives include no chills, dysuria, fever or frequency. The vaginal bleeding is lighter than menses. She has not been passing clots. She has not been passing tissue. Nothing aggravates the symptoms. She has tried nothing for the symptoms.    OB History    Gravida  2   Para      Term      Preterm      AB  2   Living        SAB      TAB  2   Ectopic      Multiple      Live Births              Past Medical History:  Diagnosis Date  . Asthma   . Headache     Past Surgical History:  Procedure Laterality Date  . DILATION AND CURETTAGE OF UTERUS    . INDUCED ABORTION      Family History  Problem Relation Age of Onset  . Alcohol abuse Neg Hx   . Arthritis Neg Hx   . Asthma Neg Hx   . Birth defects Neg Hx   . Cancer Neg Hx   . COPD Neg Hx   . Depression Neg Hx   . Diabetes Neg Hx   . Drug abuse Neg Hx   . Early death Neg Hx   . Hearing loss Neg Hx   . Heart disease Neg Hx   . Hyperlipidemia Neg Hx   . Hypertension Neg  Hx   . Kidney disease Neg Hx   . Learning disabilities Neg Hx   . Mental illness Neg Hx   . Mental retardation Neg Hx   . Miscarriages / Stillbirths Neg Hx   . Stroke Neg Hx   . Vision loss Neg Hx   . Varicose Veins Neg Hx     Social History   Tobacco Use  . Smoking status: Current Every Day Smoker    Types: Cigars  . Smokeless tobacco: Never Used  Substance Use Topics  . Alcohol use: Yes    Comment: ocasionally  . Drug use: Yes    Types: Marijuana    Comment: daily    Allergies: No Known Allergies  Medications Prior to Admission  Medication Sig Dispense Refill Last Dose  . acetaminophen (TYLENOL) 325 MG tablet Take 650 mg by mouth every 6 (six) hours as needed.     . ondansetron (ZOFRAN ODT) 4 MG disintegrating tablet Take 1 tablet (4 mg total) by mouth every 4 (four) hours as needed  for nausea or vomiting. 20 tablet 0   . promethazine (PHENERGAN) 25 MG tablet Take 1 tablet (25 mg total) by mouth every 6 (six) hours as needed for nausea or vomiting. 20 tablet 0     Review of Systems  Constitutional: Negative for chills and fever.  Gastrointestinal: Positive for nausea and vomiting.  Genitourinary: Positive for pelvic pain and vaginal bleeding. Negative for dysuria and frequency.   Physical Exam   There were no vitals taken for this visit.  Physical Exam  Nursing note and vitals reviewed. Constitutional: She is oriented to person, place, and time. She appears well-developed and well-nourished. No distress.  HENT:  Head: Normocephalic.  Cardiovascular: Normal rate.  Respiratory: Effort normal.  GI: Soft. There is no abdominal tenderness. There is no rebound.  Neurological: She is alert and oriented to person, place, and time.  Skin: Skin is warm and dry.  Psychiatric: She has a normal mood and affect.    MAU Course  Procedures  MDM  Patient has had toradol and dilaudid for pain. She has also had 1L or IV fluids. She reports that she is feeling better. She  is tolerating PO. Offered patient for me to do a pelvic exam as sometimes there can be tissue at the cervical os and removing it can help with the cramping. Patient does not wish to proceed with pelvic exam at this time. Will DC home with meds to use at home for the pain.   Assessment and Plan   1. Therapeutic abortion in first trimester   2. Pelvic pain affecting pregnancy in first trimester, antepartum    DC home Comfort measures reviewed  Bleeding precautions RX: zofran PRN #20, percocet PRN #15, ibuprofen 800mg  PRN #30  Return to MAU as needed FU with OB as planned  Follow-up Information    [facility] Follow up.   Specialty: Obstetrics and Gynecology Contact information: [address] [id number] [place] [phone]           [phone] DNP, CNM  10/15/19  4:42 AM

## 2019-10-15 NOTE — Discharge Instructions (Signed)
Follow these instructions at home: Activity  Rest as directed. Ask your health care provider what activities are safe for you.  Have someone help with home and family responsibilities during this time. General instructions  Keep track of the number of sanitary pads you use each day and how soaked (saturated) they are. Write down this information.  Monitor the amount of tissue or blood clots that you pass from your vagina. Save any large amounts of tissue for your health care provider to examine.  Do not use tampons, douche, or have sex until your health care provider approves.  Contact a health care provider if:  You have a fever or chills.  You have a foul smelling vaginal discharge.  You have more bleeding instead of less. Get help right away if:  You have severe cramps or pain in your back or abdomen.  You pass blood clots or tissue from your vagina that is walnut-sized or larger.  You soak more than 1 regular sanitary pad in an hour.  You become light-headed or weak.  You pass out.  You have feelings of sadness that take over your thoughts, or you have thoughts of hurting yourself.  This information is not intended to replace advice given to you by your health care provider. Make sure you discuss any questions you have with your health care provider. Document Revised: 08/30/2018 Document Reviewed: 06/13/2016 Elsevier Patient Education  2020 [employer].

## 2020-10-08 ENCOUNTER — Emergency Department (HOSPITAL_COMMUNITY)
Admission: EM | Admit: 2020-10-08 | Discharge: 2020-10-09 | Disposition: A | Discharge status: Home / Self Care | Payer: Self-pay | Attending: Emergency Medicine | Admitting: Emergency Medicine

## 2020-10-08 ENCOUNTER — Other Ambulatory Visit: Payer: Self-pay

## 2020-10-08 DIAGNOSIS — E860 Dehydration: Secondary | ICD-10-CM | POA: Insufficient documentation

## 2020-10-08 DIAGNOSIS — R112 Nausea with vomiting, unspecified: Secondary | ICD-10-CM | POA: Insufficient documentation

## 2020-10-08 DIAGNOSIS — R197 Diarrhea, unspecified: Secondary | ICD-10-CM | POA: Insufficient documentation

## 2020-10-08 DIAGNOSIS — J45909 Unspecified asthma, uncomplicated: Secondary | ICD-10-CM | POA: Insufficient documentation

## 2020-10-08 DIAGNOSIS — R1084 Generalized abdominal pain: Secondary | ICD-10-CM | POA: Insufficient documentation

## 2020-10-08 DIAGNOSIS — F17290 Nicotine dependence, other tobacco product, uncomplicated: Secondary | ICD-10-CM | POA: Insufficient documentation

## 2020-10-09 ENCOUNTER — Encounter (HOSPITAL_COMMUNITY): Payer: Self-pay | Admitting: Emergency Medicine

## 2020-10-09 ENCOUNTER — Emergency Department (HOSPITAL_COMMUNITY): Payer: Self-pay

## 2020-10-09 LAB — COMPREHENSIVE METABOLIC PANEL
ALT: 19 U/L (ref 0–44)
AST: 23 U/L (ref 15–41)
Albumin: 4.8 g/dL (ref 3.5–5.0)
Alkaline Phosphatase: 67 U/L (ref 38–126)
Anion gap: 17 — ABNORMAL HIGH (ref 5–15)
BUN: 19 mg/dL (ref 6–20)
CO2: 15 mmol/L — ABNORMAL LOW (ref 22–32)
Calcium: 9.8 mg/dL (ref 8.9–10.3)
Chloride: 106 mmol/L (ref 98–111)
Creatinine, Ser: 0.92 mg/dL (ref 0.44–1.00)
GFR, Estimated: >60 mL/min (ref >60)
Glucose, Bld: 153 mg/dL — ABNORMAL HIGH (ref 70–99)
Potassium: 3.3 mmol/L — ABNORMAL LOW (ref 3.5–5.1)
Sodium: 138 mmol/L (ref 135–145)
Total Bilirubin: 0.6 mg/dL (ref 0.3–1.2)
Total Protein: 8.1 g/dL (ref 6.5–8.1)

## 2020-10-09 LAB — CBC
HCT: 46.9 % — ABNORMAL HIGH (ref 36.0–46.0)
Hemoglobin: 15 g/dL (ref 12.0–15.0)
MCH: 28.1 pg (ref 26.0–34.0)
MCHC: 32 g/dL (ref 30.0–36.0)
MCV: 88 fL (ref 80.0–100.0)
Platelets: 254 10*3/uL (ref 150–400)
RBC: 5.33 MIL/uL — ABNORMAL HIGH (ref 3.87–5.11)
RDW: 14.2 % (ref 11.5–15.5)
WBC: 9.8 10*3/uL (ref 4.0–10.5)
nRBC: 0 % (ref 0.0–0.2)

## 2020-10-09 LAB — BASIC METABOLIC PANEL
Anion gap: 9 (ref 5–15)
BUN: 15 mg/dL (ref 6–20)
CO2: 22 mmol/L (ref 22–32)
Calcium: 9.3 mg/dL (ref 8.9–10.3)
Chloride: 110 mmol/L (ref 98–111)
Creatinine, Ser: 0.65 mg/dL (ref 0.44–1.00)
GFR, Estimated: >60 mL/min (ref >60)
Glucose, Bld: 97 mg/dL (ref 70–99)
Potassium: 4.2 mmol/L (ref 3.5–5.1)
Sodium: 141 mmol/L (ref 135–145)

## 2020-10-09 LAB — LACTIC ACID, PLASMA
Lactic Acid, Venous: 4.6 mmol/L (ref 0.5–1.9)
Lactic Acid, Venous: 2.3 mmol/L (ref 0.5–1.9)

## 2020-10-09 LAB — ACETAMINOPHEN LEVEL: Acetaminophen (Tylenol), Serum: <10 ug/mL — ABNORMAL LOW (ref 10–30)

## 2020-10-09 LAB — I-STAT BETA HCG BLOOD, ED (MC, WL, AP ONLY): I-stat hCG, quantitative: <5 m[IU]/mL (ref <5)

## 2020-10-09 LAB — SALICYLATE LEVEL: Salicylate Lvl: <7 mg/dL — ABNORMAL LOW (ref 7.0–30.0)

## 2020-10-09 LAB — LIPASE, BLOOD: Lipase: 20 U/L (ref 11–51)

## 2020-10-09 MED ORDER — LACTATED RINGERS IV BOLUS
1000.0000 mL | Freq: Once | INTRAVENOUS | Status: AC
  Administered 2020-10-09: 1000 mL via INTRAVENOUS

## 2020-10-09 MED ORDER — ONDANSETRON 8 MG PO TBDP: 8.0000 mg | ORAL_TABLET | Freq: Three times a day (TID) | ORAL | 0 refills | Status: DC | PRN

## 2020-10-09 MED ORDER — ONDANSETRON HCL 4 MG/2ML IJ SOLN
4.0000 mg | Freq: Once | INTRAMUSCULAR | Status: AC | PRN
  Administered 2020-10-09: 4 mg via INTRAVENOUS
  Filled 2020-10-09: qty 2

## 2020-10-09 MED ORDER — ONDANSETRON HCL 4 MG/2ML IJ SOLN
4.0000 mg | Freq: Once | INTRAMUSCULAR | Status: AC
  Administered 2020-10-09: 4 mg via INTRAVENOUS
  Filled 2020-10-09: qty 2

## 2020-10-09 MED ORDER — SODIUM CHLORIDE 0.9 % IV BOLUS (SEPSIS)
1000.0000 mL | Freq: Once | INTRAVENOUS | Status: AC
  Administered 2020-10-09: 1000 mL via INTRAVENOUS

## 2020-10-09 MED ORDER — LACTATED RINGERS IV BOLUS
2000.0000 mL | Freq: Once | INTRAVENOUS | Status: AC
  Administered 2020-10-09: 2000 mL via INTRAVENOUS

## 2020-10-09 MED ORDER — FENTANYL CITRATE (PF) 100 MCG/2ML IJ SOLN
100.0000 ug | Freq: Once | INTRAMUSCULAR | Status: AC
Start: 2020-10-09 — End: 2020-10-09
  Administered 2020-10-09: 100 ug via INTRAVENOUS
  Filled 2020-10-09: qty 2

## 2020-10-09 NOTE — ED Triage Notes (Signed)
Patient here from home reporting abd pain all over "it is in knots" that started 2 hours ago. N/v/d.

## 2020-10-09 NOTE — ED Provider Notes (Signed)
[name] Provider Note   CSN: [id number] Arrival date & time: 10/08/20  2331     History Chief Complaint  Patient presents with  . Abdominal Pain  . Nausea  . Emesis   Level 5 caveat due to acuity of condition April Fischer is a 30 y.o. female.  The history is provided by the patient.  Abdominal Pain Pain location:  Generalized Pain quality comment:  Unable to describe Pain radiates to:  Does not radiate Pain severity:  Severe Onset quality:  Sudden Timing:  Constant Progression:  Worsening Chronicity:  New Relieved by:  Nothing Worsened by:  Palpation Associated symptoms: diarrhea, nausea and vomiting   Emesis Associated symptoms: abdominal pain and diarrhea   Patient presents with abrupt onset of abdominal pain and nausea vomiting diarrhea.  No other details known on my exam due to patient distress     Past Medical History:  Diagnosis Date  . Asthma   . Headache     There are no problems to display for this patient.   Past Surgical History:  Procedure Laterality Date  . DILATION AND CURETTAGE OF UTERUS    . INDUCED ABORTION       OB History    Gravida  2   Para      Term      Preterm      AB  2   Living        SAB      IAB  2   Ectopic      Multiple      Live Births              Family History  Problem Relation Age of Onset  . Alcohol abuse Neg Hx   . Arthritis Neg Hx   . Asthma Neg Hx   . Birth defects Neg Hx   . Cancer Neg Hx   . COPD Neg Hx   . Depression Neg Hx   . Diabetes Neg Hx   . Drug abuse Neg Hx   . Early death Neg Hx   . Hearing loss Neg Hx   . Heart disease Neg Hx   . Hyperlipidemia Neg Hx   . Hypertension Neg Hx   . Kidney disease Neg Hx   . Learning disabilities Neg Hx   . Mental illness Neg Hx   . Mental retardation Neg Hx   . Miscarriages / Stillbirths Neg Hx   . Stroke Neg Hx   . Vision loss Neg Hx   . Varicose Veins Neg Hx     Social History    Tobacco Use  . Smoking status: Current Every Day Smoker    Types: Cigars  . Smokeless tobacco: Never Used  Substance Use Topics  . Alcohol use: Yes    Comment: ocasionally  . Drug use: Yes    Types: Marijuana    Comment: daily    Home Medications Prior to Admission medications   Medication Sig Start Date End Date Taking? Authorizing Provider  acetaminophen (TYLENOL) 325 MG tablet Take 650 mg by mouth every 6 (six) hours as needed.    [provider]    Allergies    Patient has no known allergies.  Review of Systems   Review of Systems  Unable to perform ROS: Acuity of condition  Gastrointestinal: Positive for abdominal pain, diarrhea, nausea and vomiting.    Physical Exam Updated Vital Signs BP 114/73   Pulse 95   Temp 97.9  F (36.6 C) (Oral)   Resp 19   Ht 1.6 m (5\' 3" )   Wt 80.7 kg   SpO2 100%   BMI 31.53 kg/m   Physical Exam CONSTITUTIONAL: Well developed/well nourished, agitated and uncomfortable appearing HEAD: Normocephalic/atraumatic EYES: EOMI/PERRL ENMT: Mucous membranes dry NECK: supple no meningeal signs CV: S1/S2 noted, no murmurs/rubs/gallops noted LUNGS: Lungs are clear to auscultation bilaterally, no apparent distress ABDOMEN: soft, moderate diffuse tenderness, no rebound or guarding, bowel sounds noted throughout abdomen GU:no cva tenderness NEURO: Pt is awake/alert/appropriate, moves all extremitiesx4.  Patient is moaning and agitated EXTREMITIES: pulses normal/equal, full ROM SKIN: warm, color normal PSYCH: Anxious and agitated  ED Results / Procedures / Treatments   Labs (all labs ordered are listed, but only abnormal results are displayed) Labs Reviewed  COMPREHENSIVE METABOLIC PANEL - Abnormal; Notable for the following components:      Result Value   Potassium 3.3 (*)    CO2 15 (*)    Glucose, Bld 153 (*)    Anion gap 17 (*)    All other components within normal limits  CBC - Abnormal; Notable for the following  components:   RBC 5.33 (*)    HCT 46.9 (*)    All other components within normal limits  LACTIC ACID, PLASMA - Abnormal; Notable for the following components:   Lactic Acid, Venous 4.6 (*)    All other components within normal limits  LACTIC ACID, PLASMA - Abnormal; Notable for the following components:   Lactic Acid, Venous 2.3 (*)    All other components within normal limits  ACETAMINOPHEN LEVEL - Abnormal; Notable for the following components:   Acetaminophen (Tylenol), Serum <10 (*)    All other components within normal limits  SALICYLATE LEVEL - Abnormal; Notable for the following components:   Salicylate Lvl <7.0 (*)    All other components within normal limits  LIPASE, BLOOD  BASIC METABOLIC PANEL  I-STAT BETA HCG BLOOD, ED ([facility], [facility], [facility] ONLY)    EKG EKG Interpretation  Date/Time:  Saturday Oct 09 2020 03:20:39 EDT Ventricular Rate:  70 PR Interval:  109 QRS Duration: 92 QT Interval:  394 QTC Calculation: 426 R Axis:   68 Text Interpretation: Sinus rhythm Short PR interval Nonspecific T abnormalities, anterior leads Confirmed by [id number] (Zadie Rhine) on 10/09/2020 3:25:30 AM   Radiology CT ABDOMEN PELVIS WO CONTRAST  Result Date: 10/09/2020 CLINICAL DATA:  Acute nonlocalized abdominal pain. EXAM: CT ABDOMEN AND PELVIS WITHOUT CONTRAST TECHNIQUE: Multidetector CT imaging of the abdomen and pelvis was performed following the standard protocol without IV contrast. COMPARISON:  None. FINDINGS: Lower chest: The lung bases are clear. Hepatobiliary: No focal liver abnormality is seen. No gallstones, gallbladder wall thickening, or biliary dilatation. Pancreas: No ductal dilatation or inflammation. Spleen: Normal in size without focal abnormality. Adrenals/Urinary Tract: Normal adrenal glands. No hydronephrosis or perinephric edema. No visualized renal calculi. No evidence of focal lesion on this noncontrast exam. Urinary bladder is partially distended without wall thickening.  Stomach/Bowel: Decompressed stomach. Fluid-filled loops of small bowel are nonobstructed nor inflamed. Normal appendix. Submucosal fatty infiltration in the ascending and transverse colon suggesting prior or chronic inflammation. No wall thickening or acute inflammatory change. Small volume of liquid stool in the distal colon. No pericolonic inflammation. Vascular/Lymphatic: Normal caliber abdominal aorta. No bulky abdominopelvic adenopathy. Reproductive: Anteverted uterus coursing slightly into the left pelvis. No adnexal mass. Other: No ascites or free air. No intra-abdominal fluid collection. Small fat containing umbilical hernia. Musculoskeletal: There are  no acute or suspicious osseous abnormalities. IMPRESSION: 1. Fluid-filled loops of small bowel and fluid in the distal colon may represent generalized enteritis or diarrheal process. No bowel inflammation or obstruction. 2. Submucosal fatty infiltration in the ascending and transverse colon suggesting prior or chronic inflammation. 3. Small fat containing umbilical hernia. Electronically Signed   By: Narda RutherfordMelanie  Sanford M.D.   On: 10/09/2020 03:06    Procedures .Critical Care Performed by: Zadie RhineWickline, Undine Nealis, MD Authorized by: Zadie RhineWickline, Dionysios Massman, MD   Critical care provider statement:    Critical care time (minutes):  60   Critical care start time:  10/09/2020 2:30 AM   Critical care end time:  10/09/2020 3:30 AM   Critical care time was exclusive of:  Separately billable procedures and treating other patients   Critical care was necessary to treat or prevent imminent or life-threatening deterioration of the following conditions:  Dehydration and metabolic crisis   Critical care was time spent personally by me on the following activities:  Examination of patient, evaluation of patient's response to treatment, development of treatment plan with patient or surrogate, pulse oximetry, ordering and review of radiographic studies, ordering and review of  laboratory studies, ordering and performing treatments and interventions, re-evaluation of patient's condition and obtaining history from patient or surrogate   I assumed direction of critical care for this patient from another provider in my specialty: no       Medications Ordered in ED Medications  ondansetron (ZOFRAN) injection 4 mg (4 mg Intravenous Given 10/09/20 0112)  lactated ringers bolus 2,000 mL (0 mLs Intravenous Stopped 10/09/20 0547)  ondansetron (ZOFRAN) injection 4 mg (4 mg Intravenous Given 10/09/20 0233)  fentaNYL (SUBLIMAZE) injection 100 mcg (100 mcg Intravenous Given 10/09/20 0234)  lactated ringers bolus 1,000 mL (0 mLs Intravenous Stopped 10/09/20 0547)  sodium chloride 0.9 % bolus 1,000 mL (1,000 mLs Intravenous New Bag/Given 10/09/20 0600)  ondansetron (ZOFRAN) injection 4 mg (4 mg Intravenous Given 10/09/20 0600)    ED Course  I have reviewed the triage vital signs and the nursing notes.  Pertinent labs & imaging results that were available during my care of the patient were reviewed by me and considered in my medical decision making (see chart for details).    MDM Rules/Calculators/A&P                          3:29 AM Patient initially reported she was having vomiting and diarrhea.  On my assessment patient was moaning and agitated and was very difficult to examine.  Patient reported "I am poisoned and I am dying " Labs reveal significant dehydration.  IV fluids and pain medications have been ordered. I ordered the emergent CT imaging.  Other than enteritis, no other acute finding Further labs are pending at this time including a lactate  5:18 AM Overall patient is improving. CT imaging not reveal any acute findings.  Patient did have significant dehydration with a lactate over 4.  However given her clinical stability and improvement, suspicion for low sepsis is low Patient reports she was diagnosed with COVID over a week ago.  She had been feeling improved and  was just taking over-the-counter medications.  She took a test yesterday that was negative and was ready to return to work.  However later in the day she began feeling nauseous and having vomiting and diarrhea. Denies any other substance ingestion. I suspect her significant dehydration is due to the vomiting and diarrhea. Will recheck  lactate and BMP  The patient is noted to have a lactate >=2. With the current information available to me, I do not think the elevated lactate is directly related to Sepsis. Elevated lactate is due to dehydration 6:51 AM Patient improved.  She is ambulatory.  Lactate is improved, dehydration and anion gap improved Suspect all of her lab abnormalities were due to dehydration Patient will be discharged home.  Patient is appropriate for d/c home.  I doubt acute abdominal emergency at this time.  We discussed strict ER return precautions including abdominal pain that migrates to RLQ, fever >100.22F with repetitive vomiting over next 8-12 hours  This patient was evaluated during a time of global shortage of iodinated contrast media. Based on guidance from the [employer] of Radiology, best practices, and local institutional approaches an alternative path for evaluating and managing the patient may have been employed in order to provide optimal care during this shortage.   Final Clinical Impression(s) / ED Diagnoses Final diagnoses:  Nausea vomiting and diarrhea  Dehydration  Generalized abdominal pain    Rx / DC Orders ED Discharge Orders         Ordered    ondansetron (ZOFRAN ODT) 8 MG disintegrating tablet  Every 8 hours PRN        10/09/20 0615           Zadie Rhine, MD 10/09/20 [phone]

## 2020-10-09 NOTE — ED Notes (Signed)
Verified specimens received by lab

## 2020-10-09 NOTE — ED Notes (Signed)
Pt was assisted from restroom with uncontrolled diarrhea. RN had unsuccessful attempt at PIV insertion. Pt unable to maintain proper positioning due to abdominal pain, n/v/d. IV consult ordered.

## 2020-10-09 NOTE — ED Notes (Signed)
Pt given blanket. She is less agitated than earlier

## 2020-10-09 NOTE — ED Notes (Signed)
Pt is as agitated as before.

## 2020-10-09 NOTE — ED Notes (Addendum)
Assisted with personal hygiene. Brief changed. Pt continues to have uncontrolled diarrhea. Pt reports temporary improvement with Zofran but nausea is worsening again.

## 2020-10-09 NOTE — ED Notes (Signed)
Patient denies pain and is resting comfortably.  

## 2020-10-09 NOTE — Discharge Instructions (Addendum)

## 2021-02-08 ENCOUNTER — Encounter (HOSPITAL_COMMUNITY): Payer: Self-pay

## 2021-02-08 ENCOUNTER — Emergency Department (HOSPITAL_COMMUNITY)
Admission: EM | Admit: 2021-02-08 | Discharge: 2021-02-08 | Disposition: A | Discharge status: Home / Self Care | Payer: Self-pay | Attending: Emergency Medicine | Admitting: Emergency Medicine

## 2021-02-08 ENCOUNTER — Other Ambulatory Visit: Payer: Self-pay

## 2021-02-08 DIAGNOSIS — R197 Diarrhea, unspecified: Secondary | ICD-10-CM | POA: Insufficient documentation

## 2021-02-08 DIAGNOSIS — R1030 Lower abdominal pain, unspecified: Secondary | ICD-10-CM | POA: Insufficient documentation

## 2021-02-08 DIAGNOSIS — Z5321 Procedure and treatment not carried out due to patient leaving prior to being seen by health care provider: Secondary | ICD-10-CM | POA: Insufficient documentation

## 2021-02-08 DIAGNOSIS — R1110 Vomiting, unspecified: Secondary | ICD-10-CM | POA: Insufficient documentation

## 2021-02-08 LAB — COMPREHENSIVE METABOLIC PANEL
ALT: 12 U/L (ref 0–44)
AST: 15 U/L (ref 15–41)
Albumin: 3.8 g/dL (ref 3.5–5.0)
Alkaline Phosphatase: 56 U/L (ref 38–126)
Anion gap: 9 (ref 5–15)
BUN: 12 mg/dL (ref 6–20)
CO2: 19 mmol/L — ABNORMAL LOW (ref 22–32)
Calcium: 8.9 mg/dL (ref 8.9–10.3)
Chloride: 109 mmol/L (ref 98–111)
Creatinine, Ser: 0.84 mg/dL (ref 0.44–1.00)
GFR, Estimated: >60 mL/min (ref >60)
Glucose, Bld: 91 mg/dL (ref 70–99)
Potassium: 4.2 mmol/L (ref 3.5–5.1)
Sodium: 137 mmol/L (ref 135–145)
Total Bilirubin: 0.3 mg/dL (ref 0.3–1.2)
Total Protein: 6.3 g/dL — ABNORMAL LOW (ref 6.5–8.1)

## 2021-02-08 LAB — CBC
HCT: 41.3 % (ref 36.0–46.0)
Hemoglobin: 13.2 g/dL (ref 12.0–15.0)
MCH: 28.7 pg (ref 26.0–34.0)
MCHC: 32 g/dL (ref 30.0–36.0)
MCV: 89.8 fL (ref 80.0–100.0)
Platelets: 205 10*3/uL (ref 150–400)
RBC: 4.6 MIL/uL (ref 3.87–5.11)
RDW: 14.4 % (ref 11.5–15.5)
WBC: 5.7 10*3/uL (ref 4.0–10.5)
nRBC: 0 % (ref 0.0–0.2)

## 2021-02-08 LAB — URINALYSIS, ROUTINE W REFLEX MICROSCOPIC
Bilirubin Urine: NEGATIVE
Glucose, UA: NEGATIVE mg/dL
Hgb urine dipstick: NEGATIVE
Ketones, ur: NEGATIVE mg/dL
Leukocytes,Ua: NEGATIVE
Nitrite: NEGATIVE
Protein, ur: NEGATIVE mg/dL
Specific Gravity, Urine: 1.015 (ref 1.005–1.030)
pH: 6 (ref 5.0–8.0)

## 2021-02-08 LAB — LIPASE, BLOOD: Lipase: 25 U/L (ref 11–51)

## 2021-02-08 LAB — I-STAT BETA HCG BLOOD, ED (MC, WL, AP ONLY): I-stat hCG, quantitative: <5 m[IU]/mL (ref <5)

## 2021-02-08 MED ORDER — ONDANSETRON 4 MG PO TBDP
4.0000 mg | ORAL_TABLET | Freq: Once | ORAL | Status: AC | PRN
  Administered 2021-02-08: 4 mg via ORAL
  Filled 2021-02-08: qty 1

## 2021-02-08 NOTE — ED Provider Notes (Signed)
Emergency Medicine Provider Triage Evaluation Note  April Fischer , a 30 y.o. female  was evaluated in triage.  Pt complains of nausea/vomiting/diarrhea that started 2 hours ago.  Reports some streaky blood in her vomit.  Reports associated crampy abdominal pain.  Denies sick contacts.  Review of Systems  Positive: N/v/d Negative: Fever, chills  Physical Exam  BP 106/83 (BP Location: Left Arm)   Pulse 87   Temp 98.4 F (36.9 C) (Oral)   Resp 17   SpO2 100%  Gen:   Awake, no distress   Resp:  Normal effort  MSK:   Moves extremities without difficulty  Other:    Medical Decision Making  Medically screening exam initiated at 6:13 AM.  Appropriate orders placed.  Viann Nielson was informed that the remainder of the evaluation will be completed by another provider, this initial triage assessment does not replace that evaluation, and the importance of remaining in the ED until their evaluation is complete.  N/v/d   Roxy Horseman, PA-C 02/08/21 0615    Gilda Crease, MD 02/09/21 [phone]

## 2021-02-08 NOTE — ED Triage Notes (Signed)
Pt c/o lower abdominal pain, vomiting and diarrhea x 2 hours.

## 2021-02-08 NOTE — ED Notes (Signed)
Pt not present in waiting room when called for vital signs,

## 2021-03-18 ENCOUNTER — Emergency Department (HOSPITAL_COMMUNITY)
Admission: EM | Admit: 2021-03-18 | Discharge: 2021-03-19 | Disposition: A | Discharge status: Home / Self Care | Payer: Self-pay | Attending: Physician Assistant | Admitting: Physician Assistant

## 2021-03-18 ENCOUNTER — Encounter (HOSPITAL_COMMUNITY): Payer: Self-pay

## 2021-03-18 ENCOUNTER — Other Ambulatory Visit: Payer: Self-pay

## 2021-03-18 DIAGNOSIS — R109 Unspecified abdominal pain: Secondary | ICD-10-CM | POA: Insufficient documentation

## 2021-03-18 DIAGNOSIS — N9489 Other specified conditions associated with female genital organs and menstrual cycle: Secondary | ICD-10-CM | POA: Insufficient documentation

## 2021-03-18 DIAGNOSIS — R112 Nausea with vomiting, unspecified: Secondary | ICD-10-CM | POA: Insufficient documentation

## 2021-03-18 DIAGNOSIS — R197 Diarrhea, unspecified: Secondary | ICD-10-CM | POA: Insufficient documentation

## 2021-03-18 DIAGNOSIS — Z5321 Procedure and treatment not carried out due to patient leaving prior to being seen by health care provider: Secondary | ICD-10-CM | POA: Insufficient documentation

## 2021-03-18 LAB — CBC WITH DIFFERENTIAL/PLATELET
Abs Immature Granulocytes: 0 10*3/uL (ref 0.00–0.07)
Basophils Absolute: 0 10*3/uL (ref 0.0–0.1)
Basophils Relative: 0 %
Eosinophils Absolute: 0 10*3/uL (ref 0.0–0.5)
Eosinophils Relative: 0 %
HCT: 46.4 % — ABNORMAL HIGH (ref 36.0–46.0)
Hemoglobin: 14.9 g/dL (ref 12.0–15.0)
Immature Granulocytes: 0 %
Lymphocytes Relative: 42 %
Lymphs Abs: 2.4 10*3/uL (ref 0.7–4.0)
MCH: 28.4 pg (ref 26.0–34.0)
MCHC: 32.1 g/dL (ref 30.0–36.0)
MCV: 88.4 fL (ref 80.0–100.0)
Monocytes Absolute: 0.4 10*3/uL (ref 0.1–1.0)
Monocytes Relative: 7 %
Neutro Abs: 2.8 10*3/uL (ref 1.7–7.7)
Neutrophils Relative %: 51 %
Platelets: 206 10*3/uL (ref 150–400)
RBC: 5.25 MIL/uL — ABNORMAL HIGH (ref 3.87–5.11)
RDW: 14.1 % (ref 11.5–15.5)
WBC: 5.6 10*3/uL (ref 4.0–10.5)
nRBC: 0 % (ref 0.0–0.2)

## 2021-03-18 LAB — COMPREHENSIVE METABOLIC PANEL
ALT: 13 U/L (ref 0–44)
AST: 20 U/L (ref 15–41)
Albumin: 4.7 g/dL (ref 3.5–5.0)
Alkaline Phosphatase: 69 U/L (ref 38–126)
Anion gap: 13 (ref 5–15)
BUN: 13 mg/dL (ref 6–20)
CO2: 19 mmol/L — ABNORMAL LOW (ref 22–32)
Calcium: 9.4 mg/dL (ref 8.9–10.3)
Chloride: 108 mmol/L (ref 98–111)
Creatinine, Ser: 0.64 mg/dL (ref 0.44–1.00)
GFR, Estimated: >60 mL/min (ref >60)
Glucose, Bld: 83 mg/dL (ref 70–99)
Potassium: 4 mmol/L (ref 3.5–5.1)
Sodium: 140 mmol/L (ref 135–145)
Total Bilirubin: 0.7 mg/dL (ref 0.3–1.2)
Total Protein: 7.9 g/dL (ref 6.5–8.1)

## 2021-03-18 LAB — LIPASE, BLOOD: Lipase: 21 U/L (ref 11–51)

## 2021-03-18 LAB — I-STAT BETA HCG BLOOD, ED (MC, WL, AP ONLY): I-stat hCG, quantitative: <5 m[IU]/mL (ref <5)

## 2021-03-18 NOTE — ED Provider Notes (Signed)
Emergency Medicine Provider Triage Evaluation Note  Shanina Kepple , a 30 y.o. female  was evaluated in triage.  Pt complains of nv that started early this morning. Further c/o dizziness that is worse with movement that started after the vomiting.  Review of Systems  Positive: Nv, dizziness, abd pain Negative: Urinary sxs  Physical Exam  BP (!) 99/46 (BP Location: Left Arm)   Pulse 60   Temp 97.6 F (36.4 C) (Oral)   Resp 18   Ht 5\' 4"  (1.626 m)   Wt 79.4 kg   SpO2 98%   BMI 30.04 kg/m  Gen:   Awake, no distress   Resp:  Normal effort  MSK:   Moves extremities without difficulty  Other:  Cn II-XII intact, 5/5 strength to the bue/ble, normal finger to nose  Medical Decision Making  Medically screening exam initiated at 4:40 PM.  Appropriate orders placed.  Kaiya Boatman was informed that the remainder of the evaluation will be completed by another provider, this initial triage assessment does not replace that evaluation, and the importance of remaining in the ED until their evaluation is complete.     Mathis Dad 03/18/21 1641    03/20/21, MD 03/20/21 [phone]

## 2021-03-18 NOTE — ED Triage Notes (Signed)
Per EMS- Patient c/o N/v/D. Patient reports that she  had a mixed drink at 0100 this AM  and abdominal pain, N/v/D began an hour later.

## 2021-04-23 ENCOUNTER — Encounter (HOSPITAL_COMMUNITY): Payer: Self-pay

## 2021-04-23 ENCOUNTER — Other Ambulatory Visit: Payer: Self-pay

## 2021-04-23 ENCOUNTER — Emergency Department (HOSPITAL_COMMUNITY)
Admission: EM | Admit: 2021-04-23 | Discharge: 2021-04-24 | Disposition: A | Discharge status: Home / Self Care | Payer: Self-pay | Attending: Emergency Medicine | Admitting: Emergency Medicine

## 2021-04-23 DIAGNOSIS — J45909 Unspecified asthma, uncomplicated: Secondary | ICD-10-CM | POA: Insufficient documentation

## 2021-04-23 DIAGNOSIS — R519 Headache, unspecified: Secondary | ICD-10-CM | POA: Insufficient documentation

## 2021-04-23 DIAGNOSIS — Z87891 Personal history of nicotine dependence: Secondary | ICD-10-CM | POA: Insufficient documentation

## 2021-04-23 DIAGNOSIS — F1210 Cannabis abuse, uncomplicated: Secondary | ICD-10-CM | POA: Insufficient documentation

## 2021-04-23 DIAGNOSIS — R6889 Other general symptoms and signs: Secondary | ICD-10-CM | POA: Insufficient documentation

## 2021-04-23 DIAGNOSIS — Z20822 Contact with and (suspected) exposure to covid-19: Secondary | ICD-10-CM | POA: Insufficient documentation

## 2021-04-23 LAB — RESP PANEL BY RT-PCR (FLU A&B, COVID) ARPGX2
Influenza A by PCR: NEGATIVE
Influenza B by PCR: NEGATIVE
SARS Coronavirus 2 by RT PCR: NEGATIVE

## 2021-04-23 MED ORDER — ACETAMINOPHEN 325 MG PO TABS
650.0000 mg | ORAL_TABLET | Freq: Once | ORAL | Status: AC
  Administered 2021-04-23: 650 mg via ORAL
  Filled 2021-04-23: qty 2

## 2021-04-23 MED ORDER — PROCHLORPERAZINE EDISYLATE 10 MG/2ML IJ SOLN
10.0000 mg | Freq: Once | INTRAMUSCULAR | Status: AC
  Administered 2021-04-23: 10 mg via INTRAVENOUS
  Filled 2021-04-23: qty 2

## 2021-04-23 MED ORDER — DIPHENHYDRAMINE HCL 50 MG/ML IJ SOLN
25.0000 mg | Freq: Once | INTRAMUSCULAR | Status: AC
  Administered 2021-04-23: 25 mg via INTRAVENOUS
  Filled 2021-04-23: qty 1

## 2021-04-23 MED ORDER — KETOROLAC TROMETHAMINE 15 MG/ML IJ SOLN: 15.0000 mg | Freq: Once | INTRAMUSCULAR | Status: DC

## 2021-04-23 MED ORDER — SODIUM CHLORIDE 0.9 % IV BOLUS
1000.0000 mL | Freq: Once | INTRAVENOUS | Status: AC
  Administered 2021-04-23: 1000 mL via INTRAVENOUS

## 2021-04-23 MED ORDER — IBUPROFEN 800 MG PO TABS
800.0000 mg | ORAL_TABLET | Freq: Once | ORAL | Status: AC
  Administered 2021-04-23: 800 mg via ORAL
  Filled 2021-04-23: qty 1

## 2021-04-23 NOTE — ED Triage Notes (Signed)
Patient left work with a bad headache, took a nap, woke up and her muscles/bones started hurting. She woke up with full body aches and chills and now feels nauseous.

## 2021-04-23 NOTE — ED Provider Notes (Signed)
[facility]-EMERGENCY DEPT Provider Note   CSN: [id number] Arrival date & time: 04/23/21  2019     History Chief Complaint  Patient presents with   Nausea   Generalized Body Aches    April Fischer is a 30 y.o. female with a past medical history significant for asthma who presents with acute onset body aches, chills, congestion.  Patient reports that began this morning with an acute centrally located headache with some photophobia.  Patient reports that she went home to take a nap, woke up with body aches and chills all over her body.  Patient is taken some tea, emergency, has not tried ibuprofen, Tylenol.  Patient does report that she has taken some nyquil with minimal relief.  Patient denies concern for pregnancy, recent sexual activity, missed period.  HPI     Past Medical History:  Diagnosis Date   Asthma    Headache     There are no problems to display for this patient.   Past Surgical History:  Procedure Laterality Date   DILATION AND CURETTAGE OF UTERUS     INDUCED ABORTION       OB History     Gravida  2   Para      Term      Preterm      AB  2   Living         SAB      IAB  2   Ectopic      Multiple      Live Births              Family History  Problem Relation Age of Onset   Alcohol abuse Neg Hx    Arthritis Neg Hx    Asthma Neg Hx    Birth defects Neg Hx    Cancer Neg Hx    COPD Neg Hx    Depression Neg Hx    Diabetes Neg Hx    Drug abuse Neg Hx    Early death Neg Hx    Hearing loss Neg Hx    Heart disease Neg Hx    Hyperlipidemia Neg Hx    Hypertension Neg Hx    Kidney disease Neg Hx    Learning disabilities Neg Hx    Mental illness Neg Hx    Mental retardation Neg Hx    Miscarriages / Stillbirths Neg Hx    Stroke Neg Hx    Vision loss Neg Hx    Varicose Veins Neg Hx     Social History   Tobacco Use   Smoking status: Former    Types: Cigars   Smokeless tobacco: Never  Vaping Use    Vaping Use: Never used  Substance Use Topics   Alcohol use: Yes    Comment: ocasionally   Drug use: Yes    Types: Marijuana    Comment: daily    Home Medications Prior to Admission medications   Medication Sig Start Date End Date Taking? Authorizing Provider  acetaminophen (TYLENOL) 325 MG tablet Take 650 mg by mouth every 6 (six) hours as needed.    [provider]  ondansetron (ZOFRAN ODT) 8 MG disintegrating tablet Take 1 tablet (8 mg total) by mouth every 8 (eight) hours as needed for nausea. 10/09/20   Zadie Rhine, MD    Allergies    Patient has no known allergies.  Review of Systems   Review of Systems  Musculoskeletal:  Positive for myalgias.  Neurological:  Positive  for headaches.  All other systems reviewed and are negative.  Physical Exam Updated Vital Signs BP 113/76 (BP Location: Left Arm)   Pulse 100   Temp 98.4 F (36.9 C) (Oral)   Resp 18   Ht 5\' 4"  (1.626 m)   Wt 77.1 kg   SpO2 100%   BMI 29.18 kg/m   Physical Exam Vitals and nursing note reviewed.  Constitutional:      General: She is not in acute distress.    Appearance: Normal appearance.  HENT:     Head: Normocephalic and atraumatic.     Mouth/Throat:     Mouth: Mucous membranes are moist.     Pharynx: Oropharynx is clear. No oropharyngeal exudate.  Eyes:     General:        Right eye: No discharge.        Left eye: No discharge.  Cardiovascular:     Rate and Rhythm: Normal rate and regular rhythm.  Pulmonary:     Effort: Pulmonary effort is normal. No respiratory distress.  Musculoskeletal:        General: No deformity.  Skin:    General: Skin is warm and dry.  Neurological:     General: No focal deficit present.     Mental Status: She is alert and oriented to person, place, and time.     Cranial Nerves: No cranial nerve deficit.     Comments: CN III through XII grossly intact.  Intact strength 5 out of 5 bilateral upper and lower extremities.  Romberg negative, gait  normal.  Alert and oriented x3.  Psychiatric:        Mood and Affect: Mood normal.        Behavior: Behavior normal.    ED Results / Procedures / Treatments   Labs (all labs ordered are listed, but only abnormal results are displayed) Labs Reviewed  RESP PANEL BY RT-PCR (FLU A&B, COVID) [id number]    EKG None  Radiology No results found.  Procedures Procedures   Medications Ordered in ED Medications  acetaminophen (TYLENOL) tablet 650 mg (has no administration in time range)  ibuprofen (ADVIL) tablet 800 mg (has no administration in time range)    ED Course  I have reviewed the triage vital signs and the nursing notes.  Pertinent labs & imaging results that were available during my care of the patient were reviewed by me and considered in my medical decision making (see chart for details).  Clinical Course as of 04/23/21 2212  Sat Apr 23, 2021  2159 Influenza A By PCR: NEGATIVE [CP]    Clinical Course User Index [CP] 2160, PA-C   MDM Rules/Calculators/A&P                         Patient with signs and symptoms of new onset upper respiratory infection, including headache, congestion, body aches and chills.  As well as nausea without vomiting.  RVP negative for COVID, flu.  Still have strong clinical suspicion that patient may have a early flu as her symptoms just began she may not be positive on the respiratory virus panel yet.  Patient has signs and symptoms of migraine without any focal neurologic deficit including photophobia, pounding headache, nausea.  Without any focal neurologic deficit minimal clinical suspicion that patient needs further work-up for headache, no neck tenderness or stiffness to suggest meningitis.  No history of cancer, headache was not sudden onset, patient afebrile at this time.  Given negative RVP, patient's overall discomfort,  will provide migraine cocktail at this time.  10:12 PM Care of April Fischer transferred to [facility] and Dr. Rhunette Croft at the end of my shift as the patient will require reassessment once labs/imaging have resulted. Patient presentation, ED course, and plan of care discussed with review of all pertinent labs and imaging. Please see his/her note for further details regarding further ED course and disposition. Plan at time of handoff is dc s/p migraine cocktail with URI treatment, follow up precautions. This may be altered or completely changed at the discretion of the oncoming team pending results of further workup.  Final Clinical Impression(s) / ED Diagnoses Final diagnoses:  None    Rx / DC Orders ED Discharge Orders     None        [place] 04/23/21 2213    Derwood Kaplan, MD 04/24/21 1406

## 2021-04-23 NOTE — ED Provider Notes (Signed)
  I assumed care of patient from previous team at shift change, please see their note for full H&P. Briefly patient is here for 1 day of body aches, chills, nasal congestion and headache.  At this point her flu COVID test is negative. She is pending migraine cocktail.  She has already gotten Tylenol and ibuprofen so we will cancel the ordered Toradol.  4098: Patient is reevaluated, she feels much better. Discussed that she most likely has flu.   Conservative care and return precautions discussed.  Return precautions were discussed with patient who states their understanding.  At the time of discharge patient denied any unaddressed complaints or concerns.  Patient is agreeable for discharge home.  Note: Portions of this report may have been transcribed using voice recognition software. Every effort was made to ensure accuracy; however, inadvertent computerized transcription errors may be present        Cristina Gong, PA-C 04/24/21 0057    Derwood Kaplan, MD 04/24/21 1402

## 2021-04-23 NOTE — Discharge Instructions (Addendum)
Today you received medications that may make you sleepy or impair your ability to make decisions.  For the next 24 hours please do not drive, operate heavy machinery, care for a small child with out another adult present, or perform any activities that may cause harm to you or someone else if you were to fall asleep or be impaired.   Please take Ibuprofen (Advil, motrin) and Tylenol (acetaminophen) to relieve your pain.    You may take up to 600 MG (3 pills) of normal strength ibuprofen every 8 hours as needed.   You make take tylenol, up to 1,000 mg (two extra strength pills) every 8 hours as needed.   It is safe to take ibuprofen and tylenol at the same time as they work differently.   Do not take more than 3,000 mg tylenol in a 24 hour period (not more than one dose every 8 hours.  Please check all medication labels as many medications such as pain and cold medications may contain tylenol.  Do not drink alcohol while taking these medications.  Do not take other NSAID'S while taking ibuprofen (such as aleve or naproxen).  Please take ibuprofen with food to decrease stomach upset.

## 2021-04-24 NOTE — ED Notes (Signed)
Patient has no concerns after AVS has been reviewed and patient education provided. Patient discharged. 

## 2021-07-02 ENCOUNTER — Encounter (HOSPITAL_COMMUNITY): Payer: Self-pay

## 2021-07-02 ENCOUNTER — Other Ambulatory Visit: Payer: Self-pay

## 2021-07-02 ENCOUNTER — Ambulatory Visit (HOSPITAL_COMMUNITY)
Admission: EM | Admit: 2021-07-02 | Discharge: 2021-07-02 | Disposition: A | Discharge status: Home / Self Care | Payer: Self-pay | Attending: Physician Assistant | Admitting: Physician Assistant

## 2021-07-02 DIAGNOSIS — N764 Abscess of vulva: Secondary | ICD-10-CM

## 2021-07-02 LAB — POC URINE PREG, ED: Preg Test, Ur: NEGATIVE

## 2021-07-02 MED ORDER — SULFAMETHOXAZOLE-TRIMETHOPRIM 800-160 MG PO TABS: 1.0000 | ORAL_TABLET | Freq: Two times a day (BID) | ORAL | 0 refills | Status: AC

## 2021-07-02 NOTE — ED Provider Notes (Signed)
[facility]    CSN: [id number] Arrival date & time: 07/02/21  1010      History   Chief Complaint Chief Complaint  Patient presents with   Abscess    HPI April Fischer is a 31 y.o. female.   Patient presents today with a 1 day history of painful vaginal lesion.  She reports that pain is rated 7 at rest but with palpation increases to 10+ on a 0-10 pain scale.  She reports pain as aching with periodic sharp pains.  She has noticed the lesion has enlarged and become hard to touch.  Reports that she had a small cyst in this area but this never been bothersome in the past and denies episodes of similar symptoms.  She is sexually active and has no concern for STI.  She denies any recent antibiotic use.  She does shave and reports last doing so approximately 2 days ago but denies any new products.  She denies history of recurrent skin infections.  Denies immunosuppression including diabetes or HIV.  She does not believe that she is pregnant but is open to testing.  She has not tried any over-the-counter medication for symptom management.   Past Medical History:  Diagnosis Date   Asthma    Headache     There are no problems to display for this patient.   Past Surgical History:  Procedure Laterality Date   DILATION AND CURETTAGE OF UTERUS     INDUCED ABORTION      OB History     Gravida  2   Para      Term      Preterm      AB  2   Living         SAB      IAB  2   Ectopic      Multiple      Live Births               Home Medications    Prior to Admission medications   Medication Sig Start Date End Date Taking? Authorizing Provider  sulfamethoxazole-trimethoprim (BACTRIM DS) 800-160 MG tablet Take 1 tablet by mouth 2 (two) times daily for 10 days. 07/02/21 07/12/21 Yes Yvon Mccord, Derry Skill, PA-C  acetaminophen (TYLENOL) 325 MG tablet Take 650 mg by mouth every 6 (six) hours as needed.    [provider]  ondansetron (ZOFRAN ODT) 8 MG  disintegrating tablet Take 1 tablet (8 mg total) by mouth every 8 (eight) hours as needed for nausea. 10/09/20   Ripley Fraise, MD    Family History Family History  Problem Relation Age of Onset   Alcohol abuse Neg Hx    Arthritis Neg Hx    Asthma Neg Hx    Birth defects Neg Hx    Cancer Neg Hx    COPD Neg Hx    Depression Neg Hx    Diabetes Neg Hx    Drug abuse Neg Hx    Early death Neg Hx    Hearing loss Neg Hx    Heart disease Neg Hx    Hyperlipidemia Neg Hx    Hypertension Neg Hx    Kidney disease Neg Hx    Learning disabilities Neg Hx    Mental illness Neg Hx    Mental retardation Neg Hx    Miscarriages / Stillbirths Neg Hx    Stroke Neg Hx    Vision loss Neg Hx    Varicose Veins Neg Hx  Social History Social History   Tobacco Use   Smoking status: Former    Types: Cigars   Smokeless tobacco: Never  Vaping Use   Vaping Use: Never used  Substance Use Topics   Alcohol use: Yes    Comment: ocasionally   Drug use: Yes    Types: Marijuana    Comment: daily     Allergies   Patient has no known allergies.   Review of Systems Review of Systems  Constitutional:  Positive for activity change. Negative for appetite change, fatigue and fever.  Respiratory:  Negative for cough and shortness of breath.   Cardiovascular:  Negative for chest pain.  Gastrointestinal:  Negative for abdominal pain, diarrhea, nausea and vomiting.  Genitourinary:  Positive for genital sores. Negative for dysuria, pelvic pain, urgency, vaginal bleeding, vaginal discharge and vaginal pain.  Neurological:  Negative for dizziness, light-headedness and headaches.    Physical Exam Triage Vital Signs ED Triage Vitals  Enc Vitals Group     BP 07/02/21 1028 (!) 134/108     Pulse Rate 07/02/21 1028 77     Resp 07/02/21 1028 17     Temp 07/02/21 1028 98.6 F (37 C)     Temp Source 07/02/21 1028 Oral     SpO2 07/02/21 1028 100 %     Weight --      Height --      Head Circumference  --      Peak Flow --      Pain Score 07/02/21 1027 5     Pain Loc --      Pain Edu? --      Excl. in [place]? --    No data found.  Updated Vital Signs BP 117/77    Pulse 77    Temp 98.6 F (37 C) (Oral)    Resp 17    LMP 06/16/2021 (Exact Date)    SpO2 100%   Visual Acuity Right Eye Distance:   Left Eye Distance:   Bilateral Distance:    Right Eye Near:   Left Eye Near:    Bilateral Near:     Physical Exam Vitals reviewed.  Constitutional:      General: She is awake. She is not in acute distress.    Appearance: Normal appearance. She is well-developed. She is not ill-appearing.     Comments: Very pleasant female appears stated age in no acute distress sitting comfortably in exam room  HENT:     Head: Normocephalic and atraumatic.  Cardiovascular:     Rate and Rhythm: Normal rate and regular rhythm.     Heart sounds: Normal heart sounds, S1 normal and S2 normal. No murmur heard. Pulmonary:     Effort: Pulmonary effort is normal.     Breath sounds: Normal breath sounds. No wheezing, rhonchi or rales.     Comments: Clear to auscultation bilaterally Abdominal:     General: Bowel sounds are normal.     Palpations: Abdomen is soft.     Tenderness: There is no abdominal tenderness. There is no right CVA tenderness, left CVA tenderness, guarding or rebound.     Comments: Benign abdominal exam  Genitourinary:    Labia:        Right: Tenderness and lesion present. No rash.        Left: No rash.      Comments: 1.5 cm x 1 cm nodule noted right labia majora with surrounding swelling without fluctuance.  Area is exquisitely tender to touch.  No active bleeding or drainage. Psychiatric:        Behavior: Behavior is cooperative.     UC Treatments / Results  Labs (all labs ordered are listed, but only abnormal results are displayed) Labs Reviewed  POC URINE PREG, ED    EKG   Radiology No results found.  Procedures Procedures (including critical care time)  Medications  Ordered in UC Medications - No data to display  Initial Impression / Assessment and Plan / UC Course  I have reviewed the triage vital signs and the nursing notes.  Pertinent labs & imaging results that were available during my care of the patient were reviewed by me and considered in my medical decision making (see chart for details).     I&D was deferred given no fluctuance on exam but we discussed that if symptoms are not improving she should follow-up with her OB/GYN or if severe go to the emergency room where ultrasound could assist procedure.  She was started on Bactrim DS twice daily for 10 days.  Discussed that if she develops any rash or oral lesions she should stop the medication and be seen immediately.  She can alternate Tylenol ibuprofen for pain.  She is to use warm compresses and soaks to soften this lesion and encourage drainage.  Recommended she follow-up with OB/GYN as soon as possible.  Discussed that if she has any worsening symptoms including severe pain, fever, nausea/vomiting, body aches, weakness she should go to the emergency room.  Strict return precautions given to which she expressed understanding.  She was provided work excuse note.  Final Clinical Impressions(s) / UC Diagnoses   Final diagnoses:  Abscess of labia     Discharge Instructions      Take Bactrim DS twice daily for 10 days.  If you develop any rash or oral lesions stop the medication to be seen immediately.  Alternate Tylenol ibuprofen for pain.  Use warm compresses and warm soaks for additional symptom relief.  I do recommend that you follow-up with OB/GYN as soon as possible.  If you have any worsening symptoms including increased pain, nausea/vomiting, weakness, fever you should go to the emergency room as we discussed.     ED Prescriptions     Medication Sig Dispense Auth. Provider   sulfamethoxazole-trimethoprim (BACTRIM DS) 800-160 MG tablet Take 1 tablet by mouth 2 (two) times daily for 10  days. 20 tablet Amely Voorheis, Derry Skill, PA-C      PDMP not reviewed this encounter.   Terrilee Croak, PA-C 07/02/21 1122

## 2021-07-02 NOTE — ED Triage Notes (Signed)
Pt presents with c/o a boil she woke up with this morning on her labia. States it feels hard.

## 2021-07-02 NOTE — Discharge Instructions (Signed)
Take Bactrim DS twice daily for 10 days.  If you develop any rash or oral lesions stop the medication to be seen immediately.  Alternate Tylenol ibuprofen for pain.  Use warm compresses and warm soaks for additional symptom relief.  I do recommend that you follow-up with OB/GYN as soon as possible.  If you have any worsening symptoms including increased pain, nausea/vomiting, weakness, fever you should go to the emergency room as we discussed.

## 2021-07-03 ENCOUNTER — Emergency Department (HOSPITAL_COMMUNITY)
Admission: EM | Admit: 2021-07-03 | Discharge: 2021-07-03 | Disposition: A | Discharge status: Home / Self Care | Payer: Self-pay | Attending: Emergency Medicine | Admitting: Emergency Medicine

## 2021-07-03 ENCOUNTER — Other Ambulatory Visit: Payer: Self-pay

## 2021-07-03 ENCOUNTER — Encounter (HOSPITAL_COMMUNITY): Payer: Self-pay | Admitting: Emergency Medicine

## 2021-07-03 DIAGNOSIS — N764 Abscess of vulva: Secondary | ICD-10-CM | POA: Insufficient documentation

## 2021-07-03 MED ORDER — CEPHALEXIN 500 MG PO CAPS: 500.0000 mg | ORAL_CAPSULE | Freq: Four times a day (QID) | ORAL | 0 refills | Status: DC

## 2021-07-03 MED ORDER — LIDOCAINE HCL 2 % IJ SOLN
10.0000 mL | Freq: Once | INTRAMUSCULAR | Status: AC
  Administered 2021-07-03: 200 mg via INTRADERMAL
  Filled 2021-07-03: qty 20

## 2021-07-03 MED ORDER — HYDROMORPHONE HCL 1 MG/ML IJ SOLN
0.5000 mg | Freq: Once | INTRAMUSCULAR | Status: AC
  Administered 2021-07-03: 0.5 mg via INTRAVENOUS
  Filled 2021-07-03: qty 1

## 2021-07-03 MED ORDER — HYDROCODONE-ACETAMINOPHEN 5-325 MG PO TABS: 1.0000 | ORAL_TABLET | Freq: Three times a day (TID) | ORAL | 0 refills | Status: AC | PRN

## 2021-07-03 NOTE — Discharge Instructions (Signed)
Please take the antibiotics I have sent to the pharmacy as prescribed.  The pain medication is only to be used for severe pain.  Return in 3 days to have the abscess look at and potentially the packing removed.  Do not scrub the area however you still may shower.  Return with any fevers, chills or inflammation/redness around the site.  These are signs of infection.  Read the information about abscesses attached to these papers.

## 2021-07-03 NOTE — ED Triage Notes (Signed)
Pt presents POV with complaints of vaginal abscess. 10/10 pain. Was seen at urgent care yesterday for same and prescribed abx.

## 2021-07-03 NOTE — ED Provider Notes (Signed)
[facility] EMERGENCY DEPARTMENT Provider Note   CSN: [id number] Arrival date & time: 07/03/21  1222     History  Chief Complaint  Patient presents with   Abscess    April Fischer is a 31 y.o. female presenting with 1 day worth of a right labial abscess.  She went to urgent care yesterday and they prescribed her a pain medication however her pain continued to get worse and she decided to come to the emergency department today.  Patient is sexually active, no concerns for any STDs.  She shaved her pelvic area 3 days ago however does not remember any cuts from the razor.  Says that occasionally she gets these abscesses on her inner thigh.  No history of diabetes or IVDU.  No diagnosis of HS. Denies any fevers, chills or other systemic symptoms.   Home Medications Prior to Admission medications   Medication Sig Start Date End Date Taking? Authorizing Provider  acetaminophen (TYLENOL) 325 MG tablet Take 650 mg by mouth every 6 (six) hours as needed.    [provider]  ondansetron (ZOFRAN ODT) 8 MG disintegrating tablet Take 1 tablet (8 mg total) by mouth every 8 (eight) hours as needed for nausea. 10/09/20   Zadie Rhine, MD  sulfamethoxazole-trimethoprim (BACTRIM DS) 800-160 MG tablet Take 1 tablet by mouth 2 (two) times daily for 10 days. 07/02/21 07/12/21  Raspet, Noberto Retort, PA-C      Allergies    Patient has no known allergies.    Review of Systems   Review of Systems See HPI  Physical Exam Updated Vital Signs BP 115/80    Pulse 87    Temp (!) 97.5 F (36.4 C) (Oral)    Resp 18    Ht 5\' 4"  (1.626 m)    Wt 77 kg    LMP 06/16/2021 (Exact Date)    SpO2 99%    BMI 29.14 kg/m  Physical Exam Vitals and nursing note reviewed.  Constitutional:      Appearance: Normal appearance.  HENT:     Head: Normocephalic and atraumatic.  Eyes:     General: No scleral icterus.    Conjunctiva/sclera: Conjunctivae normal.  Pulmonary:     Effort: Pulmonary effort  is normal. No respiratory distress.  Genitourinary:    Comments: 2.5 x 2.5 cm abscess to the right labia.  No signs of overlying cellulitis however large amounts of induration to the labial tissue.  Abscess does not extend into the labia minora or vaginal canal.  No reactive lymphadenopathy Skin:    Findings: No rash.  Neurological:     Mental Status: She is alert.  Psychiatric:        Mood and Affect: Mood normal.    ED Results / Procedures / Treatments   Labs (all labs ordered are listed, but only abnormal results are displayed) Labs Reviewed - No data to display  EKG None  Radiology No results found.  Procedures .01/28/2023Incision and Drainage  Date/Time: 07/03/2021 2:00 PM Performed by: 08/31/2021, PA-C Authorized by: Saddie Benders, PA-C   Consent:    Consent obtained:  Verbal   Consent given by:  Patient   Risks, benefits, and alternatives were discussed: yes     Risks discussed:  Incomplete drainage, bleeding, infection and pain Universal protocol:    Patient identity confirmed:  Verbally with patient Location:    Type:  Abscess   Size:  2x2   Location:  Anogenital   Anogenital location:  Vulva Pre-procedure details:    Skin preparation:  Chlorhexidine with alcohol Anesthesia:    Anesthesia method:  Local infiltration   Local anesthetic:  Lidocaine 2% w/o epi Procedure type:    Complexity:  Complex Procedure details:    Ultrasound guidance: no     Incision types:  Single straight   Incision depth:  Dermal   Wound management:  Probed and deloculated and irrigated with saline   Drainage:  Bloody and purulent   Drainage amount:  Moderate   Wound treatment:  Wound left open   Packing materials:  1/4 in iodoform gauze   Amount 1/4" iodoform:  2-3in Post-procedure details:    Procedure completion:  Tolerated with difficulty    Medications Ordered in ED Medications  lidocaine (XYLOCAINE) 2 % (with pres) injection 200 mg (200 mg Intradermal Given by  Other 07/03/21 1314)  HYDROmorphone (DILAUDID) injection 0.5 mg (0.5 mg Intravenous Given 07/03/21 1308)    ED Course/ Medical Decision Making/ A&P                           Medical Decision Making Risk Prescription drug management.  Abscess originally examined by myself and MD Lynelle Doctor. There were no signs of extension into vaginal canal. Presentation more consistent with cutaneous abscess and not bartholin's cyst.  Incision and drainage was performed by me with the RN at bedside.  Patient tolerated this with some difficulty.  She was given Dilaudid prior to the procedure due to severe pain, which she reported helped her. She was further numbed with lidocaine and 1 incision was made. Packing was placed and she understands that she should return to urgent care, or the emergency room as needed, for a wound check and potential packing removal if it has not fallen out.  She was prescribed Bactrim with the urgent care yesterday and will continue taking that.  I sent a few pills of Vicodin to the pharmacy for severe pain.  Agreeable to discharge at this time.  Final Clinical Impression(s) / ED Diagnoses Final diagnoses:  Labial abscess    Rx / DC Orders Results and diagnoses were explained to the patient. Return precautions discussed in full. Patient had no additional questions and expressed complete understanding.   This chart was dictated using voice recognition software.  Despite best efforts to proofread,  errors can occur which can change the documentation meaning.    Woodroe Chen 07/03/21 1417    Linwood Dibbles, MD 07/04/21 [phone]

## 2022-11-10 ENCOUNTER — Encounter (HOSPITAL_COMMUNITY): Payer: Self-pay

## 2022-11-10 ENCOUNTER — Other Ambulatory Visit: Payer: Self-pay

## 2022-11-10 ENCOUNTER — Inpatient Hospital Stay (HOSPITAL_COMMUNITY): Payer: Commercial Managed Care - PPO

## 2022-11-10 ENCOUNTER — Inpatient Hospital Stay (HOSPITAL_COMMUNITY)
Admission: AD | Admit: 2022-11-10 | Discharge: 2022-11-10 | Disposition: A | Discharge status: Home / Self Care | Payer: Commercial Managed Care - PPO | Attending: Obstetrics & Gynecology | Admitting: Obstetrics & Gynecology

## 2022-11-10 DIAGNOSIS — O219 Vomiting of pregnancy, unspecified: Secondary | ICD-10-CM | POA: Diagnosis not present

## 2022-11-10 DIAGNOSIS — Z3490 Encounter for supervision of normal pregnancy, unspecified, unspecified trimester: Secondary | ICD-10-CM

## 2022-11-10 DIAGNOSIS — O26891 Other specified pregnancy related conditions, first trimester: Secondary | ICD-10-CM | POA: Insufficient documentation

## 2022-11-10 DIAGNOSIS — R102 Pelvic and perineal pain: Secondary | ICD-10-CM | POA: Diagnosis present

## 2022-11-10 DIAGNOSIS — Z3A01 Less than 8 weeks gestation of pregnancy: Secondary | ICD-10-CM | POA: Diagnosis not present

## 2022-11-10 DIAGNOSIS — R1032 Left lower quadrant pain: Secondary | ICD-10-CM | POA: Diagnosis not present

## 2022-11-10 LAB — CBC
HCT: 42.6 % (ref 36.0–46.0)
Hemoglobin: 13.5 g/dL (ref 12.0–15.0)
MCH: 27.6 pg (ref 26.0–34.0)
MCHC: 31.7 g/dL (ref 30.0–36.0)
MCV: 86.9 fL (ref 80.0–100.0)
Platelets: 197 10*3/uL (ref 150–400)
RBC: 4.9 MIL/uL (ref 3.87–5.11)
RDW: 13.7 % (ref 11.5–15.5)
WBC: 6.9 10*3/uL (ref 4.0–10.5)
nRBC: 0 % (ref 0.0–0.2)

## 2022-11-10 LAB — I-STAT BETA HCG BLOOD, ED (MC, WL, AP ONLY): I-stat hCG, quantitative: >2000 m[IU]/mL — ABNORMAL HIGH (ref <5)

## 2022-11-10 LAB — COMPREHENSIVE METABOLIC PANEL
ALT: 13 U/L (ref 0–44)
AST: 16 U/L (ref 15–41)
Albumin: 4.1 g/dL (ref 3.5–5.0)
Alkaline Phosphatase: 53 U/L (ref 38–126)
Anion gap: 15 (ref 5–15)
BUN: 5 mg/dL — ABNORMAL LOW (ref 6–20)
CO2: 19 mmol/L — ABNORMAL LOW (ref 22–32)
Calcium: 9.5 mg/dL (ref 8.9–10.3)
Chloride: 104 mmol/L (ref 98–111)
Creatinine, Ser: 0.67 mg/dL (ref 0.44–1.00)
GFR, Estimated: >60 mL/min (ref >60)
Glucose, Bld: 97 mg/dL (ref 70–99)
Potassium: 3.3 mmol/L — ABNORMAL LOW (ref 3.5–5.1)
Sodium: 138 mmol/L (ref 135–145)
Total Bilirubin: 0.3 mg/dL (ref 0.3–1.2)
Total Protein: 7 g/dL (ref 6.5–8.1)

## 2022-11-10 LAB — URINALYSIS, ROUTINE W REFLEX MICROSCOPIC
Bilirubin Urine: NEGATIVE
Glucose, UA: NEGATIVE mg/dL
Hgb urine dipstick: NEGATIVE
Ketones, ur: 20 mg/dL — AB
Leukocytes,Ua: NEGATIVE
Nitrite: NEGATIVE
Protein, ur: NEGATIVE mg/dL
Specific Gravity, Urine: 1.025 (ref 1.005–1.030)
pH: 5 (ref 5.0–8.0)

## 2022-11-10 LAB — HCG, QUANTITATIVE, PREGNANCY: hCG, Beta Chain, Quant, S: 68227 m[IU]/mL — ABNORMAL HIGH (ref <5)

## 2022-11-10 LAB — LIPASE, BLOOD: Lipase: 23 U/L (ref 11–51)

## 2022-11-10 MED ORDER — SCOPOLAMINE 1 MG/3DAYS TD PT72
1.0000 | MEDICATED_PATCH | TRANSDERMAL | Status: DC
  Administered 2022-11-10: 1.5 mg via TRANSDERMAL
  Filled 2022-11-10: qty 1

## 2022-11-10 MED ORDER — ONDANSETRON 8 MG PO TBDP: 8.0000 mg | ORAL_TABLET | Freq: Three times a day (TID) | ORAL | 0 refills | Status: DC | PRN

## 2022-11-10 MED ORDER — DOXYLAMINE-PYRIDOXINE 10-10 MG PO TBEC: 2.0000 | DELAYED_RELEASE_TABLET | Freq: Every evening | ORAL | Status: DC

## 2022-11-10 MED ORDER — DOXYLAMINE-PYRIDOXINE 10-10 MG PO TBEC: 2.0000 | DELAYED_RELEASE_TABLET | Freq: Every evening | ORAL | 3 refills | Status: AC

## 2022-11-10 MED ORDER — VITAMIN B-6 25 MG PO TABS
25.0000 mg | ORAL_TABLET | Freq: Every day | ORAL | Status: DC
  Administered 2022-11-10: 25 mg via ORAL
  Filled 2022-11-10: qty 1

## 2022-11-10 MED ORDER — DOXYLAMINE SUCCINATE (SLEEP) 25 MG PO TABS
25.0000 mg | ORAL_TABLET | Freq: Every day | ORAL | Status: DC
  Administered 2022-11-10: 25 mg via ORAL
  Filled 2022-11-10: qty 1

## 2022-11-10 NOTE — MAU Provider Note (Addendum)
History     [id number]  Arrival date and time: 11/10/22 1511   Chief complaints: nausea/vomiting  HPI April Fischer is a 32 y.o. at [redacted]w[redacted]d by LMP who presents for nausea and vomiting.  This has been an ongoing issue for the past several weeks.  She notes that she is able to drink water and ginger ale, but struggles with solids.  Additionally for the past week she has started to note left lower quadrant pain.  She notes that the pain typically happens 1 time per day.  The pain is sharp will last for 5 minutes and then resolve on its own.  She has not taken any medication for this pain.  Denies vaginal bleeding or discharge.  She reports no other acute complaints  TAB x2- one medical, one surgical  Past Medical History:  Diagnosis Date   Asthma    Headache     Past Surgical History:  Procedure Laterality Date   DILATION AND CURETTAGE OF UTERUS     INDUCED ABORTION      Family History  Problem Relation Age of Onset   Alcohol abuse Neg Hx    Arthritis Neg Hx    Asthma Neg Hx    Birth defects Neg Hx    Cancer Neg Hx    COPD Neg Hx    Depression Neg Hx    Diabetes Neg Hx    Drug abuse Neg Hx    Early death Neg Hx    Hearing loss Neg Hx    Heart disease Neg Hx    Hyperlipidemia Neg Hx    Hypertension Neg Hx    Kidney disease Neg Hx    Learning disabilities Neg Hx    Mental illness Neg Hx    Mental retardation Neg Hx    Miscarriages / Stillbirths Neg Hx    Stroke Neg Hx    Vision loss Neg Hx    Varicose Veins Neg Hx     No Known Allergies  No current facility-administered medications on file prior to encounter.   Current Outpatient Medications on File Prior to Encounter  Medication Sig Dispense Refill   acetaminophen (TYLENOL) 325 MG tablet Take 650 mg by mouth every 6 (six) hours as needed.       Review of Systems  Constitutional:  Negative for chills and fever.  HENT: Negative.    Eyes: Negative.   Respiratory: Negative.    Cardiovascular: Negative.    Gastrointestinal:  Positive for abdominal pain, nausea and vomiting. Negative for constipation and diarrhea.  Genitourinary: Negative.   Musculoskeletal: Negative.   Skin:  Negative for rash.  Neurological: Negative.   Endo/Heme/Allergies: Negative.   Psychiatric/Behavioral: Negative.     Pertinent positives and negative per HPI, all others reviewed and negative  Physical Exam   BP 114/78 (BP Location: Right Arm)   Pulse 60   Temp 98.2 F (36.8 C) (Oral)   Resp 19   Ht 5\' 4"  (1.626 m)   Wt 81.9 kg   LMP 09/19/2022 (Exact Date)   SpO2 97%   BMI 30.98 kg/m   Patient Vitals for the past 24 hrs:  BP Temp Temp src Pulse Resp SpO2 Height Weight  11/10/22 2023 114/78 98.2 F (36.8 C) Oral 60 19 97 % 5\' 4"  (1.626 m) 81.9 kg  11/10/22 2000 -- 97.8 F (36.6 C) Oral -- -- -- -- --  11/10/22 2000 105/63 -- -- (!) 52 12 100 % -- --  11/10/22 1559 -- -- -- -- -- --  5\' 4"  (1.626 m) 86.2 kg  11/10/22 1524 108/74 98.2 F (36.8 C) Oral 85 18 100 % -- --    Physical Exam Constitutional:      General: She is not in acute distress.    Appearance: She is well-developed. She is not ill-appearing or diaphoretic.  HENT:     Head: Normocephalic.  Eyes:     Extraocular Movements: Extraocular movements intact.  Cardiovascular:     Rate and Rhythm: Normal rate and regular rhythm.  Pulmonary:     Effort: Pulmonary effort is normal. No respiratory distress.     Breath sounds: Normal breath sounds.  Abdominal:     Comments: Soft and non-tender, no rebound, no guarding  Skin:    General: Skin is warm and dry.  Neurological:     General: No focal deficit present.     Mental Status: She is alert.  Psychiatric:        Mood and Affect: Mood normal.        Behavior: Behavior normal.   GU: not indicated Ext: no edema, no calf tenderness bilaterally  Labs Results for orders placed or performed during the hospital encounter of 11/10/22 (from the past 24 hour(s))  Urinalysis, Routine w  reflex microscopic -Urine, Clean Catch     Status: Abnormal   Collection Time: 11/10/22  4:00 PM  Result Value Ref Range   Color, Urine YELLOW YELLOW   APPearance CLEAR CLEAR   Specific Gravity, Urine 1.025 1.005 - 1.030   pH 5.0 5.0 - 8.0   Glucose, UA NEGATIVE NEGATIVE mg/dL   Hgb urine dipstick NEGATIVE NEGATIVE   Bilirubin Urine NEGATIVE NEGATIVE   Ketones, ur 20 (A) NEGATIVE mg/dL   Protein, ur NEGATIVE NEGATIVE mg/dL   Nitrite NEGATIVE NEGATIVE   Leukocytes,Ua NEGATIVE NEGATIVE  Lipase, blood     Status: None   Collection Time: 11/10/22  4:15 PM  Result Value Ref Range   Lipase 23 11 - 51 U/L  Comprehensive metabolic panel     Status: Abnormal   Collection Time: 11/10/22  4:15 PM  Result Value Ref Range   Sodium 138 135 - 145 mmol/L   Potassium 3.3 (L) 3.5 - 5.1 mmol/L   Chloride 104 98 - 111 mmol/L   CO2 19 (L) 22 - 32 mmol/L   Glucose, Bld 97 70 - 99 mg/dL   BUN 5 (L) 6 - 20 mg/dL   Creatinine, Ser 4.09 0.44 - 1.00 mg/dL   Calcium 9.5 8.9 - 81.1 mg/dL   Total Protein 7.0 6.5 - 8.1 g/dL   Albumin 4.1 3.5 - 5.0 g/dL   AST 16 15 - 41 U/L   ALT 13 0 - 44 U/L   Alkaline Phosphatase 53 38 - 126 U/L   Total Bilirubin 0.3 0.3 - 1.2 mg/dL   GFR, Estimated >91 >47 mL/min   Anion gap 15 5 - 15  CBC     Status: None   Collection Time: 11/10/22  4:15 PM  Result Value Ref Range   WBC 6.9 4.0 - 10.5 K/uL   RBC 4.90 3.87 - 5.11 MIL/uL   Hemoglobin 13.5 12.0 - 15.0 g/dL   HCT 82.9 56.2 - 13.0 %   MCV 86.9 80.0 - 100.0 fL   MCH 27.6 26.0 - 34.0 pg   MCHC 31.7 30.0 - 36.0 g/dL   RDW 86.5 78.4 - 69.6 %   Platelets 197 150 - 400 K/uL   nRBC 0.0 0.0 - 0.2 %  I-Stat  beta hCG blood, ED     Status: Abnormal   Collection Time: 11/10/22  4:22 PM  Result Value Ref Range   I-stat hCG, quantitative >2,000.0 (H) <5 mIU/mL   Comment 3          hCG, quantitative, pregnancy     Status: Abnormal   Collection Time: 11/10/22  8:42 PM  Result Value Ref Range   hCG, Beta Chain, Quant, S  68,227 (H) <5 mIU/mL    Imaging US OB Comp Less 14 Wks  Result Date: 11/10/2022 CLINICAL DATA:  Left lower quadrant pain x1 week. EXAM: OBSTETRIC <14 WK ULTRASOUND TECHNIQUE: Transabdominal ultrasound was performed for evaluation of the gestation as well as the maternal uterus and adnexal regions. COMPARISON:  None Available. FINDINGS: Intrauterine gestational sac: Single Yolk sac:  Visualized. Embryo:  Visualized. Cardiac Activity: Visualized. Heart Rate: 146 bpm CRL:   10.7 mm   7 w 1 d                  [place] EDC: June 28, 2023 Subchorionic hemorrhage:  None visualized. Maternal uterus/adnexae: The right ovary is visualized and is normal in appearance. A corpus luteum cyst is seen within an otherwise normal appearing left ovary. No pelvic free fluid is noted. IMPRESSION: Single, viable intrauterine pregnancy at approximately 7 weeks and 1 day gestation by ultrasound evaluation. Electronically Signed   By: Aram Candela M.D.   On: 11/10/2022 21:08    MAU Course  Procedures Lab Orders         Lipase, blood         Comprehensive metabolic panel         CBC         Urinalysis, Routine w reflex microscopic -Urine, Clean Catch         hCG, quantitative, pregnancy         I-Stat beta hCG blood, ED    Meds ordered this encounter  Medications   scopolamine (TRANSDERM-SCOP) 1 MG/3DAYS 1.5 mg   DISCONTD: Doxylamine-Pyridoxine 10-10 MG TBEC 2 tablet   AND Linked Order Group    doxylamine (Sleep) (UNISOM) tablet 25 mg    pyridOXINE (VITAMIN B6) tablet 25 mg   Doxylamine-Pyridoxine (DICLEGIS) 10-10 MG TBEC    Sig: Take 2 tablets by mouth at bedtime.    Dispense:  60 tablet    Refill:  3   ondansetron (ZOFRAN ODT) 8 MG disintegrating tablet    Sig: Take 1 tablet (8 mg total) by mouth every 8 (eight) hours as needed for nausea.    Dispense:  20 tablet    Refill:  0   Imaging Orders         US OB Comp Less 14 Wks    IMPRESSION: Single, viable intrauterine pregnancy at approximately 7 weeks  and 1 day gestation by ultrasound evaluation  MDM -Nausea/vomiting in pregnancy Given po medication, refused zofran and scop patch placed Lab work completed in ED- stable, some ketones notes HCG ordered Early OB ultrasound ordered  Assessment and Plan   1) Nausea/vomiting in pregnancy -given scop patch -diclegis and zofran sent in -pt tolerating clears  2) abdominal pain -suspect round ligament pain -[place] reviewed, confirmed IUP  -encouraged pt to establish prenatal care, list given  Myna Hidalgo, DO Attending Obstetrician & Gynecologist, [facility] for [employer], [facility]

## 2022-11-10 NOTE — MAU Note (Signed)
.  April Fischer is a 32 y.o. at [redacted]w[redacted]d here in MAU reporting: N/V for the past 2 weeks. For the past week has had intermittent sharp pains in lower left abdomen that will radiate towards bottom. Denies VB or abnormal vaginal discharge.  LMP: 09/19/2022  Pain score: 8 Vitals:   11/10/22 2000 11/10/22 2023  BP:  114/78  Pulse:  60  Resp:  19  Temp: 97.8 F (36.6 C) 98.2 F (36.8 C)  SpO2:  97%     FHT:NA  Lab orders placed from triage:  none.

## 2022-11-10 NOTE — ED Triage Notes (Signed)
Pt arrived POV. [redacted] weeks pregnant V/N, sharp pelvic pain intermittent to L side radiating into buttocks.

## 2022-11-10 NOTE — ED Provider Notes (Signed)
[name] EMERGENCY DEPARTMENT AT [facility] Provider Note   HPI: April Fischer is a 32 year old female with a past medical history as below presenting today with pelvic pain.  She has had associated nausea and vomiting.  She reports this is her third pregnancy.  She has had 2 prior abortions by medication and D&C.  She reports over the last couple days she has had worsening left-sided pelvic pain.  She reports she has had some difficulty tolerating p.o. intake.  She does not like the ODT Zofran as she reports it makes her feel more nauseated.  Due to worsening of her present pain she presented to the Emergency Department.  She has not had any vaginal bleeding or vaginal discharge.  She denies any chest pain or shortness of breath.  Past Medical History:  Diagnosis Date   Asthma    Headache     Past Surgical History:  Procedure Laterality Date   DILATION AND CURETTAGE OF UTERUS     INDUCED ABORTION       Social History   Tobacco Use   Smoking status: Former    Types: Cigars   Smokeless tobacco: Never  Vaping Use   Vaping Use: Never used  Substance Use Topics   Alcohol use: Yes    Comment: ocasionally   Drug use: Yes    Types: Marijuana    Comment: daily      Review of Systems  A complete ROS was performed with pertinent positives/negatives noted in the HPI.   Vitals:   11/10/22 1524  BP: 108/74  Pulse: 85  Resp: 18  Temp: 98.2 F (36.8 C)  SpO2: 100%    Physical Exam Vitals and nursing note reviewed.  Constitutional:      General: She is not in acute distress.    Appearance: She is well-developed.  Pulmonary:     Effort: Pulmonary effort is normal. No respiratory distress.  Abdominal:     General: Abdomen is flat. There is no distension.     Palpations: Abdomen is soft.     Tenderness: There is abdominal tenderness (Left sided pelvic tenderness). There is no guarding or rebound.  Skin:    General: Skin is warm and dry.  Neurological:      Mental Status: She is alert.  Psychiatric:        Mood and Affect: Mood normal.     Procedures  MDM:  Lab results:  Results for orders placed or performed during the hospital encounter of 11/10/22 (from the past 24 hour(s))  Urinalysis, Routine w reflex microscopic -Urine, Clean Catch     Status: Abnormal   Collection Time: 11/10/22  4:00 PM  Result Value Ref Range   Color, Urine YELLOW YELLOW   APPearance CLEAR CLEAR   Specific Gravity, Urine 1.025 1.005 - 1.030   pH 5.0 5.0 - 8.0   Glucose, UA NEGATIVE NEGATIVE mg/dL   Hgb urine dipstick NEGATIVE NEGATIVE   Bilirubin Urine NEGATIVE NEGATIVE   Ketones, ur 20 (A) NEGATIVE mg/dL   Protein, ur NEGATIVE NEGATIVE mg/dL   Nitrite NEGATIVE NEGATIVE   Leukocytes,Ua NEGATIVE NEGATIVE  Lipase, blood     Status: None   Collection Time: 11/10/22  4:15 PM  Result Value Ref Range   Lipase 23 11 - 51 U/L  Comprehensive metabolic panel     Status: Abnormal   Collection Time: 11/10/22  4:15 PM  Result Value Ref Range   Sodium 138 135 - 145 mmol/L  Potassium 3.3 (L) 3.5 - 5.1 mmol/L   Chloride 104 98 - 111 mmol/L   CO2 19 (L) 22 - 32 mmol/L   Glucose, Bld 97 70 - 99 mg/dL   BUN 5 (L) 6 - 20 mg/dL   Creatinine, Ser 1.61 0.44 - 1.00 mg/dL   Calcium 9.5 8.9 - 09.6 mg/dL   Total Protein 7.0 6.5 - 8.1 g/dL   Albumin 4.1 3.5 - 5.0 g/dL   AST 16 15 - 41 U/L   ALT 13 0 - 44 U/L   Alkaline Phosphatase 53 38 - 126 U/L   Total Bilirubin 0.3 0.3 - 1.2 mg/dL   GFR, Estimated >04 >54 mL/min   Anion gap 15 5 - 15  CBC     Status: None   Collection Time: 11/10/22  4:15 PM  Result Value Ref Range   WBC 6.9 4.0 - 10.5 K/uL   RBC 4.90 3.87 - 5.11 MIL/uL   Hemoglobin 13.5 12.0 - 15.0 g/dL   HCT 09.8 11.9 - 14.7 %   MCV 86.9 80.0 - 100.0 fL   MCH 27.6 26.0 - 34.0 pg   MCHC 31.7 30.0 - 36.0 g/dL   RDW 82.9 56.2 - 13.0 %   Platelets 197 150 - 400 K/uL   nRBC 0.0 0.0 - 0.2 %  I-Stat beta hCG blood, ED     Status: Abnormal   Collection Time:  11/10/22  4:22 PM  Result Value Ref Range   I-stat hCG, quantitative >2,000.0 (H) <5 mIU/mL   Comment 3            Medical decision making: -Vital signs stable. Patient afebrile, hemodynamically stable, and non-toxic appearing. -Patient's presentation is most consistent with acute complicated illness / injury requiring diagnostic workup.. -April Fischer is a 32 y.o. female presenting to the emergency department with pelvic pain.  Patient is currently [redacted] weeks pregnant.  Last menstrual period at the end of April.  She estimates April 30.  She has not yet had an ultrasound confirming pregnancy location.  She is endorsing primary left-sided pelvic pain.  Given positive pregnancy test with this will need ectopic rule out.  Is also having nausea and vomiting.  Labs reviewed show normal lipase, CBC without acute hematologic abnormality.  CMP shows mildly low CO2 likely consistent with vomiting.  UA unremarkable.  I have discussed the patient's care with MAU who will accept her for further evaluation of her pelvic pain, nausea/vomiting, and ectopic pregnancy rule out.  Patient transferred to MAU for further workup.  Medical Decision Making Amount and/or Complexity of Data Reviewed Labs: ordered. Decision-making details documented in ED Course.     The plan for this patient was discussed with Dr. Lockie Mola, who voiced agreement and who oversaw evaluation and treatment of this patient.  Marta Lamas, MD Emergency Medicine, PGY-3  Note: Dragon medical dictation software was used in the creation of this note.   Clinical Impression:  1. Pelvic pain          Chase Caller, MD 11/10/22 2009    Virgina Norfolk, DO 11/10/22 2020

## 2022-11-10 NOTE — ED Notes (Signed)
Pt offered zofran and refused d/t "It doesn't work and will make it worse"

## 2022-11-10 NOTE — Discharge Instructions (Signed)
Prenatal Care Providers ? ?         ?[facility] ? [address] ?[phone] ? ?[facility] @ [facility]  ? [address]  ?[phone] ? ?[facility] [place]      ? [address] ?[phone]   ?         ?[facility] @ [place]    ? [address] ?[phone] ?         ?[facility] @ [place]  ? [address] ?[phone] ? ?[facility] ? [address]  ? [phone] ?    ?[facility]  ?Phone: [phone] ? ?[facility]  ?Phone: [phone] ? ?[facility] ?Phone: [phone] ? ?[facility] ?Phone: [phone] ? ?[facility] ?Phone: [phone] ? ?[facility] ?Phone: [phone] ? ?[facility]  ?Phone: [phone] ? ?

## 2022-12-06 IMAGING — CT CT ABD-PELV W/O CM
2 of 4 series · 16 of 46 positions shown, 18 images · non-contrast
Comparison: None.

CLINICAL DATA: Acute nonlocalized abdominal pain.

EXAM:
CT ABDOMEN AND PELVIS WITHOUT CONTRAST
TECHNIQUE: Multidetector CT imaging of the abdomen and pelvis was performed
following the standard protocol without IV contrast.

[Series 2: axial st · axial · 0.76mm/px · z∈[-477,-72]mm · 13 of 91 slices shown, 15 images]
[im 5/91  soft-tissue]
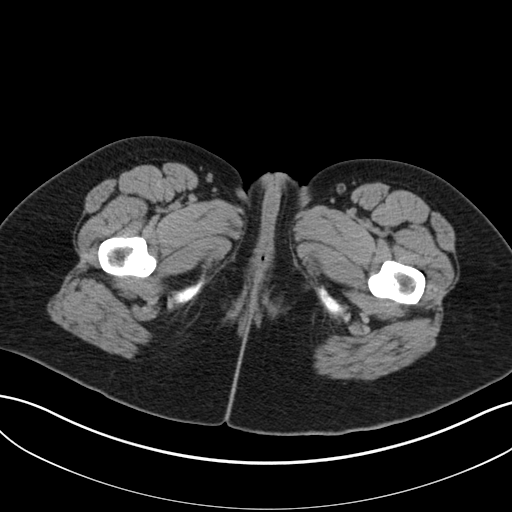
[im 5/91  bone]
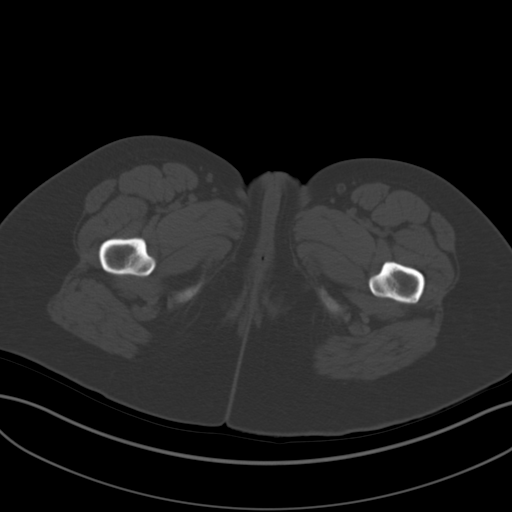
[im 14/91  soft-tissue]
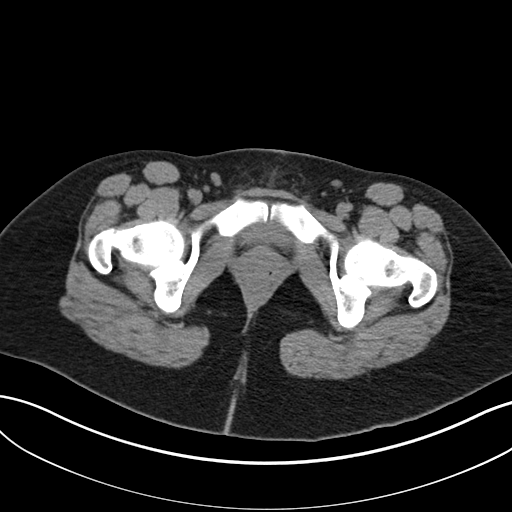
[im 19/91  soft-tissue]
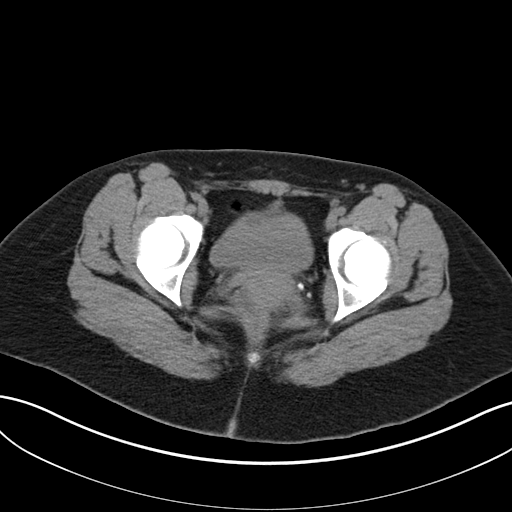
[im 28/91  soft-tissue]
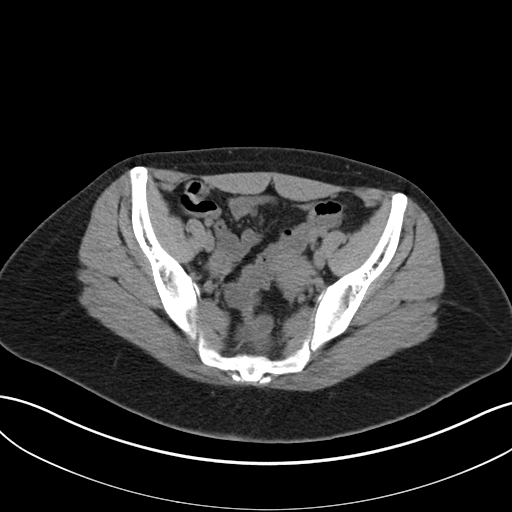
[im 32/91  soft-tissue]
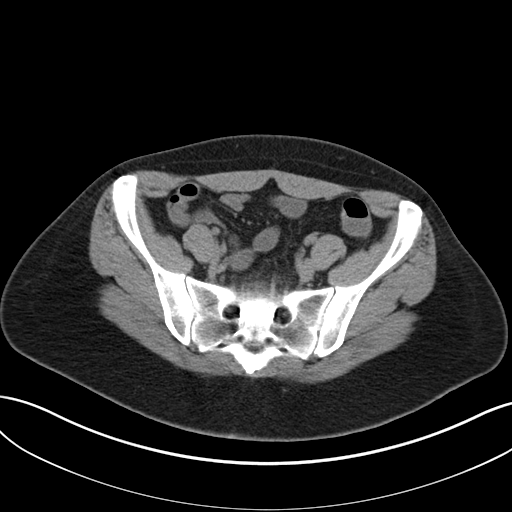
[im 41/91  soft-tissue]
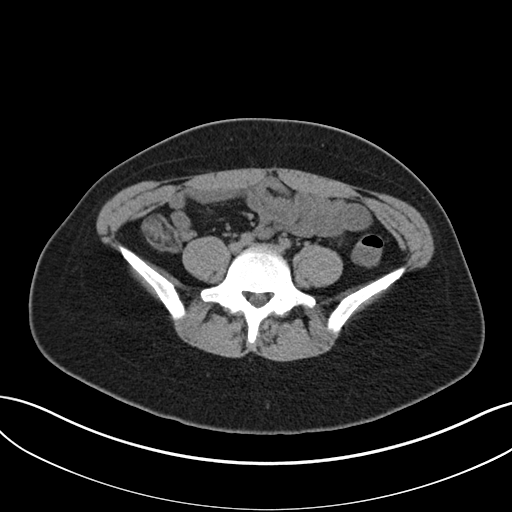
[im 46/91  soft-tissue]
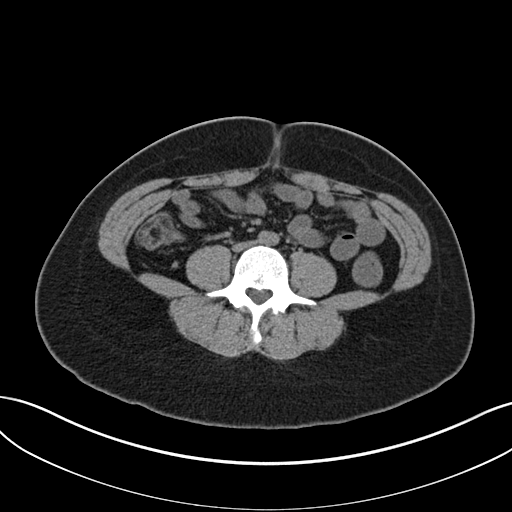
[im 50/91  soft-tissue]
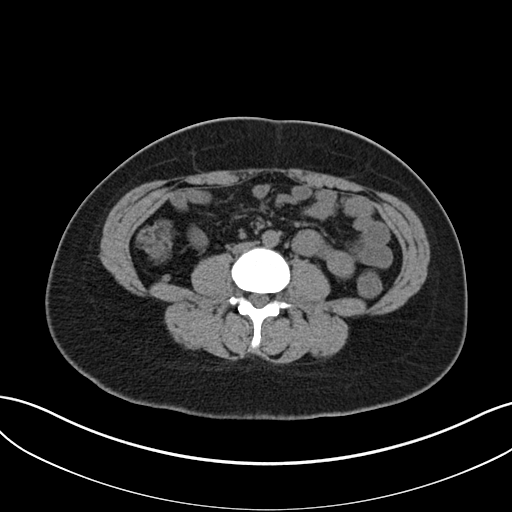
[im 59/91  soft-tissue]
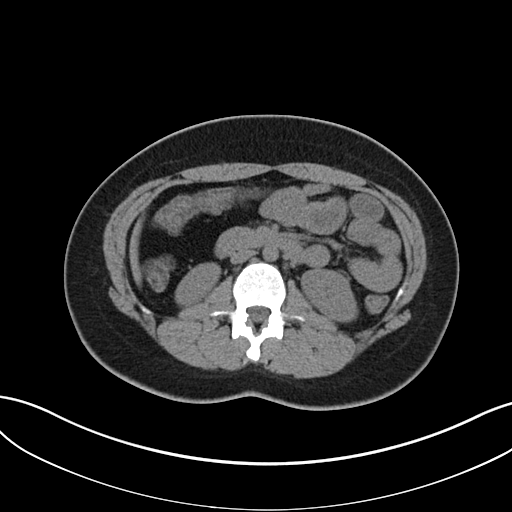
[im 59/91  bone]
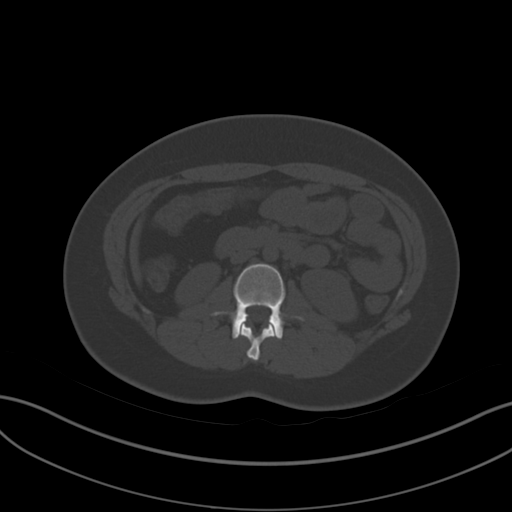
[im 64/91  soft-tissue]
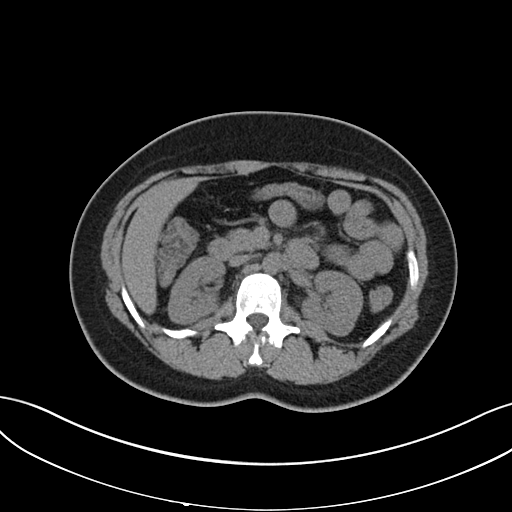
[im 73/91  soft-tissue]
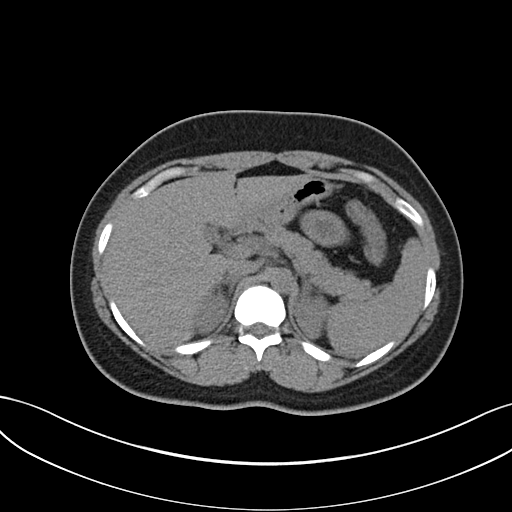
[im 77/91  soft-tissue]
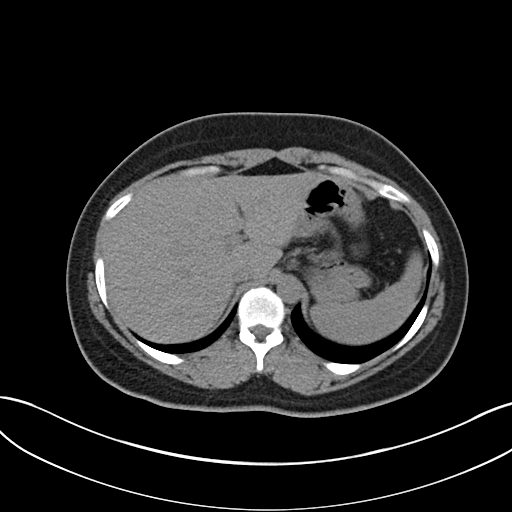
[im 86/91  soft-tissue]
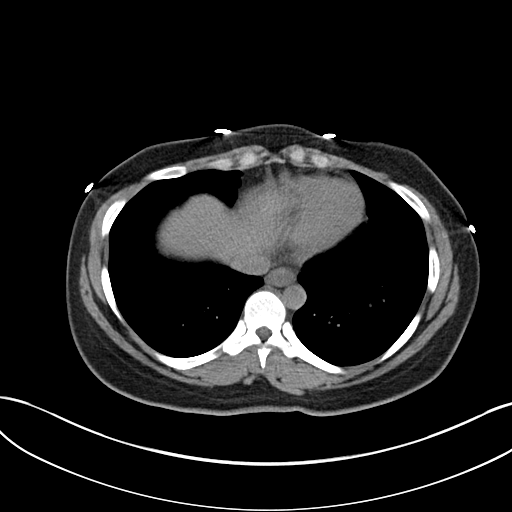

[Series 5: coronal st · coronal · 0.85mm/px · 3 of 144 slices shown]
[im 48/144  soft-tissue]
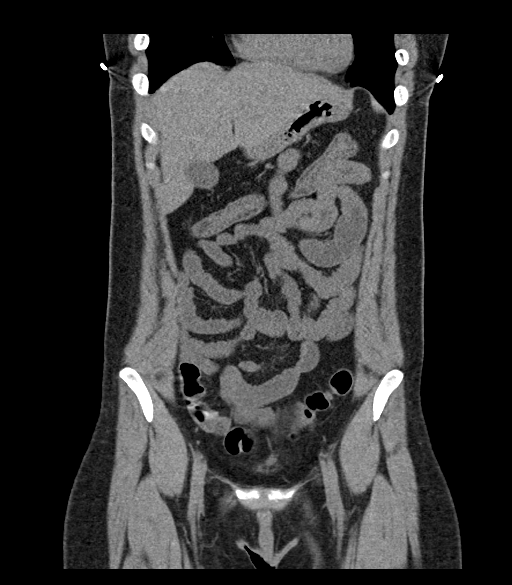
[im 64/144  soft-tissue]
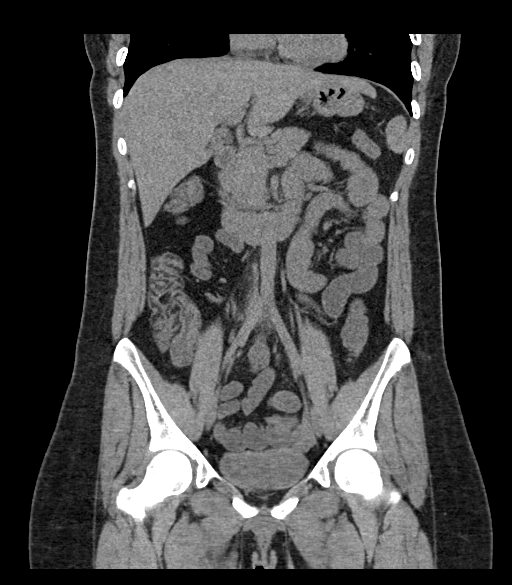
[im 80/144  soft-tissue]
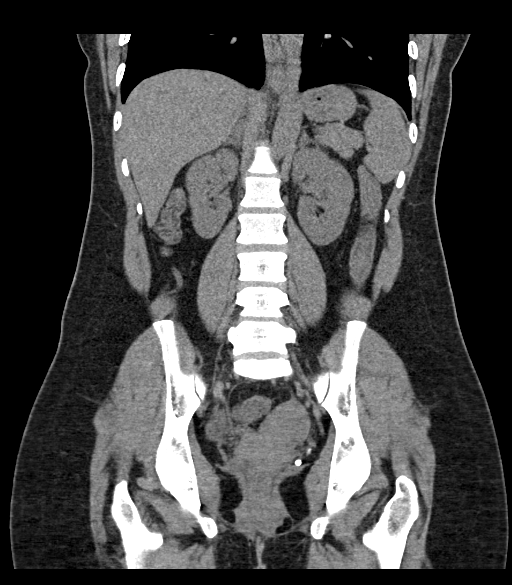

[16 of 46 positions shown; findings below may reference images not displayed]

FINDINGS: Lower chest: The lung bases are clear.

Hepatobiliary: No focal liver abnormality is seen. No gallstones,
gallbladder wall thickening, or biliary dilatation.

Pancreas: No ductal dilatation or inflammation.

Spleen: Normal in size without focal abnormality.

Adrenals/Urinary Tract: Normal adrenal glands. No hydronephrosis or
perinephric edema. No visualized renal calculi. No evidence of focal
lesion on this noncontrast exam. Urinary bladder is partially
distended without wall thickening.

Stomach/Bowel: Decompressed stomach. Fluid-filled loops of small
bowel are nonobstructed nor inflamed. Normal appendix. Submucosal
fatty infiltration in the ascending and transverse colon suggesting
prior or chronic inflammation. No wall thickening or acute
inflammatory change. Small volume of liquid stool in the distal
colon. No pericolonic inflammation.

Vascular/Lymphatic: Normal caliber abdominal aorta. No bulky
abdominopelvic adenopathy.

Reproductive: Anteverted uterus coursing slightly into the left
pelvis. No adnexal mass.

Other: No ascites or free air. No intra-abdominal fluid collection.
Small fat containing umbilical hernia.

Musculoskeletal: There are no acute or suspicious osseous
abnormalities.
IMPRESSION: 1. Fluid-filled loops of small bowel and fluid in the distal colon
may represent generalized enteritis or diarrheal process. No bowel
inflammation or obstruction.
2. Submucosal fatty infiltration in the ascending and transverse
colon suggesting prior or chronic inflammation.
3. Small fat containing umbilical hernia.

## 2023-05-08 ENCOUNTER — Ambulatory Visit: Payer: PRIVATE HEALTH INSURANCE | Admitting: Obstetrics and Gynecology

## 2023-05-08 ENCOUNTER — Other Ambulatory Visit: Payer: Self-pay

## 2023-05-08 ENCOUNTER — Encounter: Payer: Self-pay | Admitting: Obstetrics and Gynecology

## 2023-05-08 ENCOUNTER — Other Ambulatory Visit (HOSPITAL_COMMUNITY)
Admission: RE | Admit: 2023-05-08 | Discharge: 2023-05-08 | Disposition: A | Discharge status: Home / Self Care | Payer: PRIVATE HEALTH INSURANCE | Source: Ambulatory Visit | Attending: Obstetrics and Gynecology | Admitting: Obstetrics and Gynecology

## 2023-05-08 VITALS — BP 130/82 | HR 138 | Wt 191.2 lb

## 2023-05-08 DIAGNOSIS — R000 Tachycardia, unspecified: Secondary | ICD-10-CM

## 2023-05-08 DIAGNOSIS — Z124 Encounter for screening for malignant neoplasm of cervix: Secondary | ICD-10-CM

## 2023-05-08 DIAGNOSIS — Z113 Encounter for screening for infections with a predominantly sexual mode of transmission: Secondary | ICD-10-CM

## 2023-05-08 DIAGNOSIS — Z1331 Encounter for screening for depression: Secondary | ICD-10-CM

## 2023-05-08 DIAGNOSIS — Z01419 Encounter for gynecological examination (general) (routine) without abnormal findings: Secondary | ICD-10-CM | POA: Diagnosis not present

## 2023-05-08 NOTE — Progress Notes (Signed)
ANNUAL EXAM Patient name: April Fischer MRN [id number]  Date of birth: 1990/09/27 Chief Complaint:   Gynecologic Exam  History of Present Illness:   April Fischer is a 32 y.o. G3P0020 being seen today for a routine annual exam.  Current complaints: none  Menstrual concerns? Yes  none Breast or nipple changes? No  Contraception use? No Sexually active? Yes female  No FH of ovarian, breast, or colon cancer  States previously diagnosed with PCOS: had some hair growth on her chin and "small pebbles" found by prior provider Haivng some weight gain  but otherwise no other symptoms No signs of metabolic dysfunction   Patient's last menstrual period was 05/06/2023 (exact date).   The pregnancy intention screening data noted above was reviewed. Potential methods of contraception were discussed. The patient elected to proceed with No data recorded.   Last pap No results found for: "DIAGPAP", "HPVHIGH", "ADEQPAP" more than 3 years since last pap  H/O abnormal pap: no Last mammogram: n/a.  Last colonoscopy: n/a.       No data to display               No data to display           Review of Systems:   Pertinent items are noted in HPI Denies any headaches, blurred vision, fatigue, shortness of breath, chest pain, abdominal pain, abnormal vaginal discharge/itching/odor/irritation, problems with periods, bowel movements, urination, or intercourse unless otherwise stated above. Pertinent History Reviewed:  Reviewed past medical,surgical, social and family history.  Reviewed problem list, medications and allergies. Physical Assessment:   Vitals:   05/08/23 0902  BP: 130/82  Pulse: (!) 138  Weight: 191 lb 3.2 oz (86.7 kg)  Body mass index is 32.82 kg/m.        Physical Examination:   General appearance - well appearing, and in no distress  Mental status - alert, oriented to person, place, and time  Psych:  She has a normal mood and affect  Skin - warm and dry, normal  color, no suspicious lesions noted  Chest - effort normal, all lung fields clear to auscultation bilaterally  Heart - normal rate and regular rhythm  Abdomen - soft, nontender, nondistended, no masses or organomegaly  Pelvic -  VULVA: normal appearing vulva with no masses, tenderness or lesions   VAGINA: normal appearing vagina with normal color and discharge, no lesions   CERVIX: normal appearing cervix without discharge or lesions, no CMT  Thin prep pap is done with HR HPV cotesting  UTERUS: uterus is felt to be normal size, shape, consistency and nontender   ADNEXA: No adnexal masses or tenderness noted.  Extremities:  No swelling or varicosities noted  Chaperone present for exam  No results found for this or any previous visit (from the past 24 hours).    Assessment & Plan:  1. Screening examination for STI (Primary) - RPR - HIV Antibody (routine testing w rflx) - Hepatitis B Surface AntiGEN - Hepatitis C Antibody - Cytology - PAP  2. Screening for cervical cancer Pap collected - Cytology - PAP  3. Well woman exam with routine gynecological exam - Cervical cancer screening: Discussed guidelines. Pap with HPV collected - GC/CT: accepts - Birth Control:  none - Breast Health: Encouraged self breast awareness/SBE. Teaching provided.  - F/U 12 months and prn -reviewed that no intervention needed for PCOS unless symptomatic and/or metabolic dysfunction   - RPR - HIV Antibody (routine testing w rflx) - Hepatitis B  Surface AntiGEN - Hepatitis C Antibody  4. Tachycardia Reports every time she is seen her pulse is elevated despite not feeling nervous and would like to follow up with PCP - TSH, A1c and CBC ordered  No orders of the defined types were placed in this encounter.   Meds: No orders of the defined types were placed in this encounter.   Follow-up: No follow-ups on file.  Lorriane Shire, MD 05/08/2023 9:06 AM

## 2023-05-08 NOTE — Patient Instructions (Signed)
[email or web address]

## 2023-05-09 ENCOUNTER — Encounter: Payer: Self-pay | Admitting: Obstetrics and Gynecology

## 2023-05-09 LAB — HEPATITIS C ANTIBODY: Hep C Virus Ab: NONREACTIVE

## 2023-05-09 LAB — HEPATITIS B SURFACE ANTIGEN: Hepatitis B Surface Ag: NEGATIVE

## 2023-05-09 LAB — RPR: RPR Ser Ql: NONREACTIVE

## 2023-05-09 LAB — HIV ANTIBODY (ROUTINE TESTING W REFLEX): HIV Screen 4th Generation wRfx: NONREACTIVE

## 2023-05-10 LAB — CYTOLOGY - PAP
Chlamydia: NEGATIVE
Comment: NEGATIVE
Comment: NEGATIVE
Comment: NEGATIVE
Comment: NORMAL
Diagnosis: NEGATIVE
High risk HPV: NEGATIVE
Neisseria Gonorrhea: NEGATIVE
Trichomonas: NEGATIVE

## 2023-05-11 ENCOUNTER — Encounter: Payer: Self-pay | Admitting: Obstetrics and Gynecology

## 2023-07-14 ENCOUNTER — Other Ambulatory Visit: Payer: Self-pay

## 2023-07-14 ENCOUNTER — Encounter (HOSPITAL_COMMUNITY): Payer: Self-pay

## 2023-07-14 ENCOUNTER — Emergency Department (HOSPITAL_COMMUNITY)
Admission: EM | Admit: 2023-07-14 | Discharge: 2023-07-14 | Disposition: A | Discharge status: Home / Self Care | Payer: PRIVATE HEALTH INSURANCE | Attending: Emergency Medicine | Admitting: Emergency Medicine

## 2023-07-14 DIAGNOSIS — E860 Dehydration: Secondary | ICD-10-CM | POA: Diagnosis not present

## 2023-07-14 DIAGNOSIS — R42 Dizziness and giddiness: Secondary | ICD-10-CM

## 2023-07-14 DIAGNOSIS — R109 Unspecified abdominal pain: Secondary | ICD-10-CM | POA: Diagnosis present

## 2023-07-14 LAB — COMPREHENSIVE METABOLIC PANEL WITH GFR
ALT: 12 U/L (ref 0–44)
AST: 22 U/L (ref 15–41)
Albumin: 4.5 g/dL (ref 3.5–5.0)
Alkaline Phosphatase: 69 U/L (ref 38–126)
Anion gap: 16 — ABNORMAL HIGH (ref 5–15)
BUN: 8 mg/dL (ref 6–20)
CO2: 17 mmol/L — ABNORMAL LOW (ref 22–32)
Calcium: 9.1 mg/dL (ref 8.9–10.3)
Chloride: 110 mmol/L (ref 98–111)
Creatinine, Ser: 0.63 mg/dL (ref 0.44–1.00)
GFR, Estimated: >60 mL/min
Glucose, Bld: 80 mg/dL (ref 70–99)
Potassium: 4.7 mmol/L (ref 3.5–5.1)
Sodium: 143 mmol/L (ref 135–145)
Total Bilirubin: 0.5 mg/dL (ref 0.0–1.2)
Total Protein: 7.8 g/dL (ref 6.5–8.1)

## 2023-07-14 LAB — CBC WITH DIFFERENTIAL/PLATELET
Abs Immature Granulocytes: 0.01 K/uL (ref 0.00–0.07)
Basophils Absolute: 0 K/uL (ref 0.0–0.1)
Basophils Relative: 0 %
Eosinophils Absolute: 0 K/uL (ref 0.0–0.5)
Eosinophils Relative: 0 %
HCT: 45.9 % (ref 36.0–46.0)
Hemoglobin: 13.8 g/dL (ref 12.0–15.0)
Immature Granulocytes: 0 %
Lymphocytes Relative: 37 %
Lymphs Abs: 2.6 K/uL (ref 0.7–4.0)
MCH: 28.4 pg (ref 26.0–34.0)
MCHC: 30.1 g/dL (ref 30.0–36.0)
MCV: 94.4 fL (ref 80.0–100.0)
Monocytes Absolute: 0.5 K/uL (ref 0.1–1.0)
Monocytes Relative: 7 %
Neutro Abs: 3.9 K/uL (ref 1.7–7.7)
Neutrophils Relative %: 56 %
Platelets: 222 K/uL (ref 150–400)
RBC: 4.86 MIL/uL (ref 3.87–5.11)
RDW: 14.3 % (ref 11.5–15.5)
WBC: 7 K/uL (ref 4.0–10.5)
nRBC: 0 % (ref 0.0–0.2)

## 2023-07-14 LAB — HCG, SERUM, QUALITATIVE: Preg, Serum: NEGATIVE

## 2023-07-14 LAB — URINALYSIS, ROUTINE W REFLEX MICROSCOPIC
Bilirubin Urine: NEGATIVE
Glucose, UA: NEGATIVE mg/dL
Hgb urine dipstick: NEGATIVE
Ketones, ur: 5 mg/dL — AB
Leukocytes,Ua: NEGATIVE
Nitrite: NEGATIVE
Protein, ur: NEGATIVE mg/dL
Specific Gravity, Urine: 1.011 (ref 1.005–1.030)
pH: 5 (ref 5.0–8.0)

## 2023-07-14 LAB — LIPASE, BLOOD: Lipase: 39 U/L (ref 11–51)

## 2023-07-14 MED ORDER — ONDANSETRON 4 MG PO TBDP: ORAL_TABLET | ORAL | 0 refills | Status: DC

## 2023-07-14 MED ORDER — SODIUM CHLORIDE 0.9 % IV BOLUS: 1000.0000 mL | Freq: Once | INTRAVENOUS | Status: DC

## 2023-07-14 MED ORDER — PANTOPRAZOLE SODIUM 40 MG IV SOLR
40.0000 mg | Freq: Once | INTRAVENOUS | Status: AC
  Administered 2023-07-14: 40 mg via INTRAVENOUS
  Filled 2023-07-14: qty 10

## 2023-07-14 MED ORDER — MECLIZINE HCL 25 MG PO TABS
25.0000 mg | ORAL_TABLET | Freq: Once | ORAL | Status: AC
  Administered 2023-07-14: 25 mg via ORAL
  Filled 2023-07-14: qty 1

## 2023-07-14 MED ORDER — ONDANSETRON HCL 4 MG/2ML IJ SOLN
4.0000 mg | Freq: Once | INTRAMUSCULAR | Status: AC
  Administered 2023-07-14: 4 mg via INTRAVENOUS
  Filled 2023-07-14: qty 2

## 2023-07-14 MED ORDER — MECLIZINE HCL 25 MG PO TABS: 25.0000 mg | ORAL_TABLET | Freq: Three times a day (TID) | ORAL | 0 refills | Status: DC | PRN

## 2023-07-14 NOTE — Discharge Instructions (Addendum)
 Take the medications as needed for nausea and dizziness.

## 2023-07-14 NOTE — ED Triage Notes (Addendum)
 Pt BIB [facility] from home with reports of vomiting and abdominal pain after drinking tequila last night. Pt thinks she has alcohol poisoning.

## 2023-07-14 NOTE — ED Provider Notes (Signed)
 Urinalysis is normal.  Patient has told staff she needs to leave as her ride is here.  Patient is stable for discharge   Linwood Dibbles, MD 07/14/23 [phone]

## 2023-07-14 NOTE — ED Provider Notes (Signed)
 [place] EMERGENCY DEPARTMENT AT [facility] Provider Note   CSN: [id number] Arrival date & time: 07/14/23  1158     History  Chief Complaint  Patient presents with   Abdominal Pain    April Fischer is a 33 y.o. female.  Patient is a 33 year old female who presents with nausea vomiting dizziness.  She states that she drink tequila last night, about 4 drinks.  This morning she started having some nausea and vomiting associated with dizziness which she describes as a spinning sensation.  She has a headache when she tries to sit up.  It is worse when she moves her head around.  She has had this previously 1 time with alcohol ingestion.  She denies any recent head injury.  No numbness or weakness to her extremities.  No vision changes or speech deficits.  No associate abdominal pain.  No fevers or other recent illnesses.       Home Medications Prior to Admission medications   Medication Sig Start Date End Date Taking? Authorizing Provider  meclizine (ANTIVERT) 25 MG tablet Take 1 tablet (25 mg total) by mouth 3 (three) times daily as needed for dizziness. 07/14/23  Yes Rolan Bucco, MD  ondansetron (ZOFRAN-ODT) 4 MG disintegrating tablet 4mg  ODT q4 hours prn nausea/vomit 07/14/23  Yes Rolan Bucco, MD  acetaminophen (TYLENOL) 325 MG tablet Take 650 mg by mouth every 6 (six) hours as needed.    [provider]      Allergies    Patient has no known allergies.    Review of Systems   Review of Systems  Constitutional:  Negative for chills, diaphoresis, fatigue and fever.  HENT:  Negative for congestion, rhinorrhea and sneezing.   Eyes: Negative.   Respiratory:  Negative for cough, chest tightness and shortness of breath.   Cardiovascular:  Negative for chest pain and leg swelling.  Gastrointestinal:  Positive for nausea and vomiting. Negative for abdominal pain, blood in stool and diarrhea.  Genitourinary:  Negative for difficulty urinating, flank pain,  frequency and hematuria.  Musculoskeletal:  Negative for arthralgias and back pain.  Skin:  Negative for rash.  Neurological:  Positive for dizziness and headaches. Negative for speech difficulty, weakness and numbness.    Physical Exam Updated Vital Signs BP (!) 103/56   Pulse (!) 101   Temp 98.3 F (36.8 C) (Oral)   Resp 18   Ht 5\' 4"  (1.626 m)   Wt 86.7 kg   SpO2 99%   BMI 32.81 kg/m  Physical Exam Constitutional:      Appearance: She is well-developed.  HENT:     Head: Normocephalic and atraumatic.  Eyes:     Pupils: Pupils are equal, round, and reactive to light.     Comments: Horizontal nystagmus with fast component to the right, no rotational or vertical nystagmus  Cardiovascular:     Rate and Rhythm: Normal rate and regular rhythm.     Heart sounds: Normal heart sounds.  Pulmonary:     Effort: Pulmonary effort is normal. No respiratory distress.     Breath sounds: Normal breath sounds. No wheezing or rales.  Chest:     Chest wall: No tenderness.  Abdominal:     General: Bowel sounds are normal.     Palpations: Abdomen is soft.     Tenderness: There is no abdominal tenderness. There is no guarding or rebound.  Musculoskeletal:        General: Normal range of motion.  Cervical back: Normal range of motion and neck supple.  Lymphadenopathy:     Cervical: No cervical adenopathy.  Skin:    General: Skin is warm and dry.     Findings: No rash.  Neurological:     Mental Status: She is alert and oriented to person, place, and time.     Comments: Motor 5/5 all extremities Sensation grossly intact to LT all extremities CN II-XII grossly intact       ED Results / Procedures / Treatments   Labs (all labs ordered are listed, but only abnormal results are displayed) Labs Reviewed  COMPREHENSIVE METABOLIC PANEL - Abnormal; Notable for the following components:      Result Value   CO2 17 (*)    Anion gap 16 (*)    All other components within normal limits   LIPASE, BLOOD  CBC WITH DIFFERENTIAL/PLATELET  HCG, SERUM, QUALITATIVE  URINALYSIS, ROUTINE W REFLEX MICROSCOPIC    EKG None  Radiology No results found.  Procedures Procedures    Medications Ordered in ED Medications  sodium chloride 0.9 % bolus 1,000 mL (1,000 mLs Intravenous Bolus 07/14/23 1404)  ondansetron (ZOFRAN) injection 4 mg (4 mg Intravenous Given 07/14/23 1404)  pantoprazole (PROTONIX) injection 40 mg (40 mg Intravenous Given 07/14/23 1403)  meclizine (ANTIVERT) tablet 25 mg (25 mg Oral Given 07/14/23 1404)    ED Course/ Medical Decision Making/ A&P                                 Medical Decision Making Amount and/or Complexity of Data Reviewed Labs: ordered.  Risk Prescription drug management.   Patient is a 33 year old who presents with nausea vomiting and vertiginous type symptoms.  Her abdomen is nontender.  Labs are reviewed and are nonconcerning.  She was given IV fluids and meclizine as well as Zofran.  She seems to be feeling better and has been able to ambulate.  Urinalysis is pending.  Will try a p.o. challenge.  Dr. Lynelle Doctor to take over care pending urinalysis and reassessment after p.o. challenge.  Final Clinical Impression(s) / ED Diagnoses Final diagnoses:  Vertigo  Dehydration    Rx / DC Orders ED Discharge Orders          Ordered    meclizine (ANTIVERT) 25 MG tablet  3 times daily PRN        07/14/23 1533    ondansetron (ZOFRAN-ODT) 4 MG disintegrating tablet        07/14/23 1533              Rolan Bucco, MD 07/14/23 1540

## 2023-11-29 ENCOUNTER — Emergency Department (HOSPITAL_COMMUNITY)
Admission: EM | Admit: 2023-11-29 | Discharge: 2023-11-29 | Disposition: A | Discharge status: Home / Self Care | Payer: PRIVATE HEALTH INSURANCE

## 2023-11-29 ENCOUNTER — Emergency Department (HOSPITAL_COMMUNITY): Payer: PRIVATE HEALTH INSURANCE

## 2023-11-29 ENCOUNTER — Encounter (HOSPITAL_COMMUNITY): Payer: Self-pay

## 2023-11-29 ENCOUNTER — Other Ambulatory Visit: Payer: Self-pay

## 2023-11-29 DIAGNOSIS — R059 Cough, unspecified: Secondary | ICD-10-CM | POA: Diagnosis present

## 2023-11-29 DIAGNOSIS — J069 Acute upper respiratory infection, unspecified: Secondary | ICD-10-CM | POA: Diagnosis not present

## 2023-11-29 LAB — CBC WITH DIFFERENTIAL/PLATELET
Abs Immature Granulocytes: 0.01 K/uL (ref 0.00–0.07)
Basophils Absolute: 0 K/uL (ref 0.0–0.1)
Basophils Relative: 1 %
Eosinophils Absolute: 0.1 K/uL (ref 0.0–0.5)
Eosinophils Relative: 2 %
HCT: 43.5 % (ref 36.0–46.0)
Hemoglobin: 13.9 g/dL (ref 12.0–15.0)
Immature Granulocytes: 0 %
Lymphocytes Relative: 38 %
Lymphs Abs: 1.8 K/uL (ref 0.7–4.0)
MCH: 28.8 pg (ref 26.0–34.0)
MCHC: 32 g/dL (ref 30.0–36.0)
MCV: 90.1 fL (ref 80.0–100.0)
Monocytes Absolute: 0.5 K/uL (ref 0.1–1.0)
Monocytes Relative: 11 %
Neutro Abs: 2.3 K/uL (ref 1.7–7.7)
Neutrophils Relative %: 48 %
Platelets: 185 K/uL (ref 150–400)
RBC: 4.83 MIL/uL (ref 3.87–5.11)
RDW: 14.6 % (ref 11.5–15.5)
WBC: 4.8 K/uL (ref 4.0–10.5)
nRBC: 0 % (ref 0.0–0.2)

## 2023-11-29 LAB — COMPREHENSIVE METABOLIC PANEL WITH GFR
ALT: 16 U/L (ref 0–44)
AST: 18 U/L (ref 15–41)
Albumin: 4 g/dL (ref 3.5–5.0)
Alkaline Phosphatase: 59 U/L (ref 38–126)
Anion gap: 11 (ref 5–15)
BUN: 11 mg/dL (ref 6–20)
CO2: 22 mmol/L (ref 22–32)
Calcium: 9.2 mg/dL (ref 8.9–10.3)
Chloride: 105 mmol/L (ref 98–111)
Creatinine, Ser: 0.74 mg/dL (ref 0.44–1.00)
GFR, Estimated: >60 mL/min (ref >60)
Glucose, Bld: 90 mg/dL (ref 70–99)
Potassium: 3.8 mmol/L (ref 3.5–5.1)
Sodium: 138 mmol/L (ref 135–145)
Total Bilirubin: 0.3 mg/dL (ref 0.0–1.2)
Total Protein: 7.1 g/dL (ref 6.5–8.1)

## 2023-11-29 LAB — URINALYSIS, ROUTINE W REFLEX MICROSCOPIC
Bacteria, UA: NONE SEEN
Bilirubin Urine: NEGATIVE
Glucose, UA: NEGATIVE mg/dL
Hgb urine dipstick: NEGATIVE
Ketones, ur: NEGATIVE mg/dL
Nitrite: NEGATIVE
Protein, ur: NEGATIVE mg/dL
Specific Gravity, Urine: 1.02 (ref 1.005–1.030)
pH: 5 (ref 5.0–8.0)

## 2023-11-29 LAB — WET PREP, GENITAL
Clue Cells Wet Prep HPF POC: NONE SEEN
Sperm: NONE SEEN
Trich, Wet Prep: NONE SEEN
WBC, Wet Prep HPF POC: <10 (ref <10)
Yeast Wet Prep HPF POC: NONE SEEN

## 2023-11-29 LAB — HCG, SERUM, QUALITATIVE: Preg, Serum: POSITIVE — AB

## 2023-11-29 LAB — RESP PANEL BY RT-PCR (RSV, FLU A&B, COVID)  RVPGX2
Influenza A by PCR: NEGATIVE
Influenza B by PCR: NEGATIVE
Resp Syncytial Virus by PCR: NEGATIVE
SARS Coronavirus 2 by RT PCR: NEGATIVE

## 2023-11-29 MED ORDER — FLUTICASONE PROPIONATE 50 MCG/ACT NA SUSP
2.0000 | Freq: Every day | NASAL | Status: DC
  Filled 2023-11-29: qty 16

## 2023-11-29 NOTE — ED Provider Notes (Signed)
 [place] EMERGENCY DEPARTMENT AT [facility] Provider Note   CSN: [id number] Arrival date & time: 11/29/23  0730     Patient presents with: Shortness of Breath   April Fischer is a 33 y.o. female with history of PCOS and irregular periods, presents with complaint of dry cough and nasal congestion for the past 3 days.  States she has been trying to use Mucinex for her symptoms without relief.  She denies any fever, chills, sore throat.  She reports a intermittent left lower abdominal pain for the past week along with some spotting. Denies any concern for STIs. Last bowel movement was last night. No previous abdominal surgeries.     Shortness of Breath      Prior to Admission medications   Medication Sig Start Date End Date Taking? Authorizing Provider  acetaminophen  (TYLENOL ) 325 MG tablet Take 650 mg by mouth every 6 (six) hours as needed.    [provider]  meclizine  (ANTIVERT ) 25 MG tablet Take 1 tablet (25 mg total) by mouth 3 (three) times daily as needed for dizziness. 07/14/23   Randol Simmonds, MD  ondansetron  (ZOFRAN -ODT) 4 MG disintegrating tablet 4mg  ODT q4 hours prn nausea/vomit 07/14/23   Randol Simmonds, MD    Allergies: Patient has no known allergies.    Review of Systems  Respiratory:  Positive for shortness of breath.     Updated Vital Signs BP 121/83 (BP Location: Left Arm)   Pulse 97   Temp 99.6 F (37.6 C) (Oral)   Resp 17   Ht 5' 4 (1.626 m)   Wt 87 kg   SpO2 100%   BMI 32.92 kg/m   Physical Exam Vitals and nursing note reviewed. Exam conducted with a chaperone present.  Constitutional:      General: She is not in acute distress.    Appearance: She is well-developed.     Comments: Sounds congested when talking  HENT:     Head: Normocephalic and atraumatic.     Nose: Congestion present.     Mouth/Throat:     Pharynx: No oropharyngeal exudate or posterior oropharyngeal erythema.  Eyes:     Conjunctiva/sclera: Conjunctivae  normal.  Cardiovascular:     Rate and Rhythm: Normal rate and regular rhythm.     Heart sounds: No murmur heard. Pulmonary:     Effort: Pulmonary effort is normal. No respiratory distress.     Breath sounds: Normal breath sounds.     Comments: Talking in full sentences on room air without difficulty Abdominal:     General: Bowel sounds are normal.     Palpations: Abdomen is soft.     Tenderness: There is no abdominal tenderness.  Genitourinary:    Comments: RN Danielle present to chaperone pelvic exam  No lesions or erythema of the external genitalia No blood in vaginal vault. Cervix healthy appearing, non-friable Musculoskeletal:        General: No swelling.     Cervical back: Neck supple.  Lymphadenopathy:     Cervical: No cervical adenopathy.  Skin:    General: Skin is warm and dry.     Capillary Refill: Capillary refill takes less than 2 seconds.  Neurological:     Mental Status: She is alert.  Psychiatric:        Mood and Affect: Mood normal.     (all labs ordered are listed, but only abnormal results are displayed) Labs Reviewed  URINALYSIS, ROUTINE W REFLEX MICROSCOPIC - Abnormal; Notable for the following components:  Result Value   Leukocytes,Ua TRACE (*)    All other components within normal limits  RESP PANEL BY RT-PCR (RSV, FLU A&B, COVID)  RVPGX2  WET PREP, GENITAL  CBC WITH DIFFERENTIAL/PLATELET  COMPREHENSIVE METABOLIC PANEL WITH GFR  PREGNANCY, URINE  HCG, SERUM, QUALITATIVE    EKG: None  Radiology: DG Chest 2 View Result Date: 11/29/2023 CLINICAL DATA:  Cough EXAM: CHEST - 2 VIEW COMPARISON:  10/19/2010 FINDINGS: The heart size and mediastinal contours are within normal limits. Both lungs are clear. The visualized skeletal structures are unremarkable. IMPRESSION: No active cardiopulmonary disease is radiographically apparent. Electronically Signed   By: Ryan Salvage M.D.   On: 11/29/2023 08:01     Procedures   Medications Ordered in  the ED  fluticasone  (FLONASE ) 50 MCG/ACT nasal spray 2 spray (has no administration in time range)                                    Medical Decision Making Amount and/or Complexity of Data Reviewed Labs: ordered. Radiology: ordered.     Differential diagnosis includes but is not limited to COVID, flu, RSV, viral URI, strep pharyngitis, viral pharyngitis, allergic rhinitis, pneumonia, bronchitis, ovarian cyst, diverticulitis, ovarian torsion, colitis, constipation, abnormal uterine bleeding, pregnancy   ED Course:  Upon initial evaluation, patient is well-appearing, no acute distress. Stable vitals. Lungs clear to auscultation. Does have nasal congestion on exam. Abdomen soft and non-tender.   Labs Ordered: I Ordered, and personally interpreted labs.  The pertinent results include:   CBC and CMP within normal limits Urinalysis with trace leukocytes Wet prep negative for yeast, trichomoniasis, BV COVID, flu, RSV negative  Imaging Studies ordered: I ordered imaging studies including chest x-ray I independently visualized the imaging with scope of interpretation limited to determining acute life threatening conditions related to emergency care. Imaging showed no acute abnormality I agree with the radiologist interpretation  Medications Given: Fluticasone   Upon re-evaluation, patient remains well-appearing with stable vital signs.  Discussed that her chest x-ray did not show any signs of pneumonia.  COVID, flu, RSV negative.  Suspect viral URI as the cause of her congestion and cough.  We discussed over-the-counter medications that she is at home for symptoms.  She was reporting some intermittent left lower quadrant abdominal pain over the past week as well as some vaginal spotting.  On pelvic exam, no blood in the vaginal vault.  No abnormal vaginal discharge.  She has negative for yeast, BV, trichomoniasis.  Denies any concern for STIs.  No cervical motion, no cervical abdominal  tenderness palpation, low concern for PID.  Given no abdominal tenderness to palpation on exam today, stable vitals, and unremarkable labs, low concern for acute intra-abdominal pathology.  Do not feel she needs any CT imaging today.  Urinalysis without signs of UTI. Stable and appropriate for close follow-up with her PCP and OB/GYN.    Impression: Viral URI  Disposition:  The patient was discharged home with instructions to use over-the-counter medications as needed for symptom control.  Follow-up with PCP if symptoms not improving within the next 5 days. Return precautions given.    This chart was dictated using voice recognition software, Dragon. Despite the best efforts of this provider to proofread and correct errors, errors may still occur which can change documentation meaning.       Final diagnoses:  Viral URI    ED Discharge Orders  None          Veta Palma, PA-C 11/29/23 1033    Neysa Caron PARAS, DO 11/29/23 1521

## 2023-11-29 NOTE — Discharge Instructions (Signed)
 You appear to have an upper respiratory infection (URI). An upper respiratory tract infection, or cold, is a viral infection of the air passages leading to the lungs. It should improve gradually after 5-7 days. You may have a lingering cough that lasts for 2- 4 weeks after the infection.  Your flu, covid, and RSV test were negative today.  Your chest x-ray did not show any signs of pneumonia  Your blood counts, electrolytes, kidney, and liver labs were normal today.  You tested negative for yeast and bacterial vaginosis.  It is unclear as to the cause of your abdominal pain at this time.  Please follow-up with your PCP or OBGYN within the next week if pain continues to persist.  There are no medications, such as antibiotics, that will cure your infection.   Home care instructions:  You can take Tylenol  and/or Ibuprofen  as directed on the packaging for fever reduction and pain relief.    For cough: honey 1/2 to 1 teaspoon (you can dilute the honey in water or another fluid).  You can also use guaifenesin and dextromethorphan  for cough which are over-the-counter medications. You can use a humidifier for chest congestion and cough.  If you don't have a humidifier, you can sit in the bathroom with the hot shower running.      For sore throat: try warm salt water gargles, cepacol lozenges, throat spray, warm tea or water with lemon/honey, popsicles or ice, or OTC cold relief medicine for throat discomfort.    For congestion: Flonase  (Fluticasone ) 1-2 sprays in each nostril daily. This is an over the counter medication.    It is important to stay hydrated: drink plenty of fluids (water, gatorade/powerade/pedialyte, juices, or teas) to keep your throat moisturized and help further relieve irritation/discomfort.   Your illness is contagious and can be spread to others, especially during the first 3 or 4 days. It cannot be cured by antibiotics or other medicines. Take basic precautions such as washing your  hands often, covering your mouth when you cough or sneeze, and avoiding public places where you could spread your illness to others.   Follow-up instructions: Please follow-up with your primary care provider for further evaluation of your symptoms if you are not feeling better within the next 5 days.   Return instructions:  Please return to the Emergency Department if you experience worsening symptoms.  RETURN IMMEDIATELY IF you develop shortness of breath, confusion or altered mental status, a new rash, become dizzy, faint, or poorly responsive, or are unable to be cared for at home. Please return if you have persistent vomiting and cannot keep down fluids or develop a fever that is not controlled by tylenol  or motrin .   Please return if you have any other emergent concerns.

## 2023-11-29 NOTE — ED Triage Notes (Signed)
 Patient presented to ER with cough and shortness of breath for a few days. Patient also endorses pelvic pain and spotting for a few days as well.

## 2023-11-30 ENCOUNTER — Encounter (HOSPITAL_COMMUNITY): Payer: Self-pay

## 2023-11-30 ENCOUNTER — Inpatient Hospital Stay (HOSPITAL_COMMUNITY): Payer: PRIVATE HEALTH INSURANCE

## 2023-11-30 ENCOUNTER — Inpatient Hospital Stay (HOSPITAL_COMMUNITY)
Admission: AD | Admit: 2023-11-30 | Discharge: 2023-12-01 | Disposition: A | Discharge status: Home / Self Care | Payer: PRIVATE HEALTH INSURANCE | Attending: Obstetrics & Gynecology | Admitting: Obstetrics & Gynecology

## 2023-11-30 DIAGNOSIS — R109 Unspecified abdominal pain: Secondary | ICD-10-CM | POA: Insufficient documentation

## 2023-11-30 DIAGNOSIS — O209 Hemorrhage in early pregnancy, unspecified: Secondary | ICD-10-CM

## 2023-11-30 DIAGNOSIS — O3680X0 Pregnancy with inconclusive fetal viability, not applicable or unspecified: Secondary | ICD-10-CM | POA: Diagnosis not present

## 2023-11-30 DIAGNOSIS — O26891 Other specified pregnancy related conditions, first trimester: Secondary | ICD-10-CM | POA: Insufficient documentation

## 2023-11-30 DIAGNOSIS — Z3A01 Less than 8 weeks gestation of pregnancy: Secondary | ICD-10-CM

## 2023-11-30 DIAGNOSIS — J069 Acute upper respiratory infection, unspecified: Secondary | ICD-10-CM | POA: Diagnosis not present

## 2023-11-30 LAB — CBC
HCT: 42.1 % (ref 36.0–46.0)
Hemoglobin: 13.7 g/dL (ref 12.0–15.0)
MCH: 28.5 pg (ref 26.0–34.0)
MCHC: 32.5 g/dL (ref 30.0–36.0)
MCV: 87.7 fL (ref 80.0–100.0)
Platelets: 196 K/uL (ref 150–400)
RBC: 4.8 MIL/uL (ref 3.87–5.11)
RDW: 14.6 % (ref 11.5–15.5)
WBC: 7.5 K/uL (ref 4.0–10.5)
nRBC: 0 % (ref 0.0–0.2)

## 2023-11-30 LAB — COMPREHENSIVE METABOLIC PANEL WITH GFR
ALT: 12 U/L (ref 0–44)
AST: 18 U/L (ref 15–41)
Albumin: 3.8 g/dL (ref 3.5–5.0)
Alkaline Phosphatase: 51 U/L (ref 38–126)
Anion gap: 3 — ABNORMAL LOW (ref 5–15)
BUN: 9 mg/dL (ref 6–20)
CO2: 30 mmol/L (ref 22–32)
Calcium: 9.2 mg/dL (ref 8.9–10.3)
Chloride: 106 mmol/L (ref 98–111)
Creatinine, Ser: 0.72 mg/dL (ref 0.44–1.00)
GFR, Estimated: >60 mL/min (ref >60)
Glucose, Bld: 111 mg/dL — ABNORMAL HIGH (ref 70–99)
Potassium: 3.9 mmol/L (ref 3.5–5.1)
Sodium: 139 mmol/L (ref 135–145)
Total Bilirubin: 0.5 mg/dL (ref 0.0–1.2)
Total Protein: 6.7 g/dL (ref 6.5–8.1)

## 2023-11-30 LAB — URINALYSIS, ROUTINE W REFLEX MICROSCOPIC
Bilirubin Urine: NEGATIVE
Glucose, UA: NEGATIVE mg/dL
Ketones, ur: NEGATIVE mg/dL
Nitrite: NEGATIVE
Protein, ur: NEGATIVE mg/dL
RBC / HPF: >50 RBC/hpf (ref 0–5)
Specific Gravity, Urine: 1.02 (ref 1.005–1.030)
pH: 5 (ref 5.0–8.0)

## 2023-11-30 LAB — ABO/RH: ABO/RH(D): O POS

## 2023-11-30 LAB — HCG, QUANTITATIVE, PREGNANCY: hCG, Beta Chain, Quant, S: 888 m[IU]/mL — ABNORMAL HIGH (ref <5)

## 2023-11-30 MED ORDER — ACETAMINOPHEN 500 MG PO TABS
1000.0000 mg | ORAL_TABLET | Freq: Once | ORAL | Status: AC
  Administered 2023-11-30: 1000 mg via ORAL
  Filled 2023-11-30: qty 2

## 2023-11-30 NOTE — MAU Note (Signed)
 MAU Triage Note:  .April Fischer is a 33 y.o. at Unknown here in MAU reporting: was seen at [facility] ED for dry cough, nasal congestion, left lower abdominal pain, and spotting. They did a pelvic exam and some test which came back positive for pregnancy. She started having heavier bleeding last night and now reports light bleeding. Changing pads every couple of hours. The VB and pain has been on-going for the past week and half.   Patient complaint: Severe ABD Pn, Bleeding, Cramping  Pain Score: 9  Pain Location: Abdomen    Onset of complaint: On-going LMP: Patient's last menstrual period was 10/15/2023.  Vitals:   11/30/23 1939  BP: 122/77  Pulse: 93  Resp: 16  Temp: 98.2 F (36.8 C)  SpO2: 100%    Lab orders placed from triage: UA, wet prep, G/C

## 2023-12-02 ENCOUNTER — Inpatient Hospital Stay (HOSPITAL_COMMUNITY)
Admission: AD | Admit: 2023-12-02 | Discharge: 2023-12-03 | Disposition: A | Discharge status: Home / Self Care | Payer: PRIVATE HEALTH INSURANCE | Attending: Obstetrics and Gynecology | Admitting: Obstetrics and Gynecology

## 2023-12-02 DIAGNOSIS — O3680X0 Pregnancy with inconclusive fetal viability, not applicable or unspecified: Secondary | ICD-10-CM | POA: Insufficient documentation

## 2023-12-02 DIAGNOSIS — Z3A01 Less than 8 weeks gestation of pregnancy: Secondary | ICD-10-CM | POA: Insufficient documentation

## 2023-12-02 DIAGNOSIS — Z87891 Personal history of nicotine dependence: Secondary | ICD-10-CM | POA: Diagnosis not present

## 2023-12-02 DIAGNOSIS — O209 Hemorrhage in early pregnancy, unspecified: Secondary | ICD-10-CM | POA: Diagnosis present

## 2023-12-02 DIAGNOSIS — N939 Abnormal uterine and vaginal bleeding, unspecified: Secondary | ICD-10-CM

## 2023-12-02 LAB — HCG, QUANTITATIVE, PREGNANCY: hCG, Beta Chain, Quant, S: 253 m[IU]/mL — ABNORMAL HIGH (ref <5)

## 2023-12-02 NOTE — MAU Note (Signed)
 MAU Triage Note:  .April Fischer is a 33 y.o. at [redacted]w[redacted]d here in MAU reporting: here for repeat hCG. Passed a few clots yesterday and has continued to have lower abdominal pain. Pain is intermittent.   Patient complaint: labs  Pain Score: 0-No painDenies pain at this time     Onset of complaint: on-going LMP: Patient's last menstrual period was 10/15/2023.  Vitals:   12/02/23 2220  BP: 119/85  Pulse: 63  Resp: 16  Temp: 98.5 F (36.9 C)  SpO2: 100%     Lab orders placed from triage: orders in prior to triage

## 2023-12-03 ENCOUNTER — Other Ambulatory Visit: Payer: Self-pay

## 2023-12-03 DIAGNOSIS — O3680X0 Pregnancy with inconclusive fetal viability, not applicable or unspecified: Secondary | ICD-10-CM

## 2023-12-03 DIAGNOSIS — Z3A01 Less than 8 weeks gestation of pregnancy: Secondary | ICD-10-CM | POA: Diagnosis not present

## 2023-12-03 DIAGNOSIS — O039 Complete or unspecified spontaneous abortion without complication: Secondary | ICD-10-CM

## 2023-12-03 NOTE — MAU Provider Note (Signed)
 History     CSN: [id number]  Arrival date and time: 12/02/23 2205   Event Date/Time   First Provider Initiated Contact with Patient    Chief Complaint  Patient presents with   Abdominal Pain   Vaginal Bleeding    HPI  April Fischer is a 33 y.o. G4P0030 at [redacted]w[redacted]d who presents to the MAU for repeat labs. Pt returning for repeat hCG today. She was seen on 7/11 for VB, U/S showed PUL. hCG at the time was 888. She reports ongoing VB and passage of some tissue/clots yesterday and earlier today. She reports changing a soaked pad every hour. No lightheadedness/dizziness. No fevers or chills. Having some abdominal pain.  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   Headache    History of PCOS 02/02/2019   Irregular periods 01/23/2018    Past Surgical History:  Procedure Laterality Date   DILATION AND CURETTAGE OF UTERUS     x2   INDUCED ABORTION      Family History  Problem Relation Age of Onset   Alcohol abuse Neg Hx    Arthritis Neg Hx    Asthma Neg Hx    Birth defects Neg Hx    Cancer Neg Hx    COPD Neg Hx    Depression Neg Hx    Diabetes Neg Hx    Drug abuse Neg Hx    Early death Neg Hx    Hearing loss Neg Hx    Heart disease Neg Hx    Hyperlipidemia Neg Hx    Hypertension Neg Hx    Kidney disease Neg Hx    Learning disabilities Neg Hx    Mental illness Neg Hx    Mental retardation Neg Hx    Miscarriages / Stillbirths Neg Hx    Stroke Neg Hx    Vision loss Neg Hx    Varicose Veins Neg Hx     Social History   Tobacco Use   Smoking status: Former    Types: Cigars   Smokeless tobacco: Never  Vaping Use   Vaping status: Never Used  Substance Use Topics   Alcohol use: Yes    Comment: ocasionally   Drug use: Yes    Types: Marijuana    Comment: daily    Allergies: No Known Allergies  No medications prior to  admission.    ROS reviewed and pertinent positives and negatives as documented in HPI.  Physical Exam   Blood pressure 119/85, pulse 63, temperature 98.5 F (36.9 C), temperature source Oral, resp. rate 16, height 5' 4 (1.626 m), weight 87 kg, last menstrual period 10/15/2023, SpO2 100%, unknown if currently breastfeeding.  Physical Exam Exam conducted with a chaperone present.  Constitutional:      General: She is not in acute distress.    Appearance: Normal appearance. She is not ill-appearing.  HENT:     Head: Normocephalic and atraumatic.  Cardiovascular:     Rate and Rhythm: Normal rate.  Pulmonary:     Effort: Pulmonary effort is normal.     Breath sounds: Normal breath sounds.  Abdominal:     Palpations: Abdomen is soft.     Tenderness: There is no abdominal tenderness. There is no guarding.  Genitourinary:    Comments: Three small clots seen in vagina, no active bleeding noted coming from cervix. Cx visually closed and thick.  Musculoskeletal:        General: Normal range of motion.  Skin:    General: Skin is warm and dry.     Findings: No rash.  Neurological:     General: No focal deficit present.     Mental Status: She is alert and oriented to person, place, and time.     MAU Course  Procedures  MDM 33 y.o. G4P0030 at [redacted]w[redacted]d presenting for VB in setting of PUL. She is well appearing with a speculum exam revealing scant bleeding. hCG today downtrending -- 888 to 253 today. Based on hCG pattern, most likely c/w ongoing Ab, however unable to definitively r/o that this is a resolving ectopic pregnancy in setting of unknown location of pregnancy. Discussed w pt that she would likely continue have have some VB, return precautions given. Will repeat hCG in 72 hours to further assess pattern. Briefly discussed options of expectant vs medication management vs surgical management. Pt given return precautions as well. Stable for d/c.  Assessment and Plan     ICD-10-CM   1.  Pregnancy of unknown anatomic location  O36.80X0     2. Vaginal bleeding  N93.9     hCG downtrending F/up hCG in 72 hours, scheduled at [facility] Discharged w ectopic precautions/return precautions   Alain Sor, MD OB Fellow, Faculty Practice [facility] [address], Center for [facility]  12/03/2023, 2:14 AM

## 2023-12-05 ENCOUNTER — Other Ambulatory Visit: Payer: Self-pay

## 2023-12-05 ENCOUNTER — Encounter: Payer: Self-pay | Admitting: Family Medicine

## 2023-12-05 ENCOUNTER — Ambulatory Visit (INDEPENDENT_AMBULATORY_CARE_PROVIDER_SITE_OTHER): Payer: PRIVATE HEALTH INSURANCE | Admitting: *Deleted

## 2023-12-05 VITALS — BP 106/76 | HR 73 | Ht 64.0 in | Wt 191.8 lb

## 2023-12-05 DIAGNOSIS — O039 Complete or unspecified spontaneous abortion without complication: Secondary | ICD-10-CM

## 2023-12-05 DIAGNOSIS — O3680X0 Pregnancy with inconclusive fetal viability, not applicable or unspecified: Secondary | ICD-10-CM

## 2023-12-05 DIAGNOSIS — O209 Hemorrhage in early pregnancy, unspecified: Secondary | ICD-10-CM

## 2023-12-05 DIAGNOSIS — Z3A01 Less than 8 weeks gestation of pregnancy: Secondary | ICD-10-CM

## 2023-12-05 LAB — BETA HCG QUANT (REF LAB): hCG Quant: 77 m[IU]/mL

## 2023-12-05 NOTE — Progress Notes (Signed)
 Pt presents for stat BHCG following MAU visit on 11/30/23. She reports having much less vaginal bleeding and only minimal pain. She was advised to return to MAU if she develops heavy bleeding or worsening abdominal pain.  Lab drawn and pt was advised she will be called later today with tet results and next steps in care. She stated that a detailed message can be left on voicemail if she does not answer.   1330  BHCG result (77) reviewed by Dr. Cresenzo who finds this is consistent with miscarriage. He recommends non-stat BHCG in one week. I called pt and informed her of test result as well as need for re-test in one week. She voiced understanding and agreed to lab appt on 7/24 @ 3:35 pm.

## 2023-12-13 ENCOUNTER — Other Ambulatory Visit: Payer: PRIVATE HEALTH INSURANCE

## 2023-12-13 ENCOUNTER — Other Ambulatory Visit: Payer: Self-pay

## 2023-12-13 DIAGNOSIS — O039 Complete or unspecified spontaneous abortion without complication: Secondary | ICD-10-CM

## 2023-12-14 LAB — BETA HCG QUANT (REF LAB): hCG Quant: 4 m[IU]/mL

## 2023-12-25 ENCOUNTER — Encounter: Payer: Self-pay | Admitting: Nurse Practitioner

## 2023-12-25 ENCOUNTER — Ambulatory Visit: Payer: PRIVATE HEALTH INSURANCE | Admitting: Nurse Practitioner

## 2023-12-25 ENCOUNTER — Ambulatory Visit: Payer: Self-pay | Admitting: Nurse Practitioner

## 2023-12-25 VITALS — BP 126/82 | HR 80 | Temp 98.7°F | Ht 64.0 in | Wt 193.8 lb

## 2023-12-25 DIAGNOSIS — L680 Hirsutism: Secondary | ICD-10-CM

## 2023-12-25 DIAGNOSIS — R61 Generalized hyperhidrosis: Secondary | ICD-10-CM

## 2023-12-25 DIAGNOSIS — L709 Acne, unspecified: Secondary | ICD-10-CM | POA: Diagnosis not present

## 2023-12-25 DIAGNOSIS — F411 Generalized anxiety disorder: Secondary | ICD-10-CM

## 2023-12-25 DIAGNOSIS — F4322 Adjustment disorder with anxiety: Secondary | ICD-10-CM | POA: Diagnosis not present

## 2023-12-25 LAB — CBC
HCT: 43.3 % (ref 36.0–46.0)
Hemoglobin: 13.8 g/dL (ref 12.0–15.0)
MCHC: 31.9 g/dL (ref 30.0–36.0)
MCV: 87.2 fl (ref 78.0–100.0)
Platelets: 201 K/uL (ref 150.0–400.0)
RBC: 4.97 Mil/uL (ref 3.87–5.11)
RDW: 15.2 % (ref 11.5–15.5)
WBC: 5.2 K/uL (ref 4.0–10.5)

## 2023-12-25 LAB — COMPREHENSIVE METABOLIC PANEL WITH GFR
ALT: 8 U/L (ref 0–35)
AST: 13 U/L (ref 0–37)
Albumin: 4.6 g/dL (ref 3.5–5.2)
Alkaline Phosphatase: 65 U/L (ref 39–117)
BUN: 17 mg/dL (ref 6–23)
CO2: 28 meq/L (ref 19–32)
Calcium: 9.2 mg/dL (ref 8.4–10.5)
Chloride: 106 meq/L (ref 96–112)
Creatinine, Ser: 0.86 mg/dL (ref 0.40–1.20)
GFR: 89.39 mL/min (ref >60.00)
Glucose, Bld: 87 mg/dL (ref 70–99)
Potassium: 4.2 meq/L (ref 3.5–5.1)
Sodium: 140 meq/L (ref 135–145)
Total Bilirubin: 0.3 mg/dL (ref 0.2–1.2)
Total Protein: 7.4 g/dL (ref 6.0–8.3)

## 2023-12-25 LAB — TSH: TSH: 0.84 u[IU]/mL (ref 0.35–5.50)

## 2023-12-25 LAB — CORTISOL: Cortisol, Plasma: 8.4 ug/dL

## 2023-12-25 LAB — URIC ACID: Uric Acid, Serum: 4.5 mg/dL (ref 2.4–7.0)

## 2023-12-25 LAB — HEMOGLOBIN A1C: Hgb A1c MFr Bld: 6.1 % (ref 4.6–6.5)

## 2023-12-25 NOTE — Progress Notes (Unsigned)
 Established Patient Visit  Patient: April Fischer   DOB: 1991-02-18   33 y.o. Female  MRN: [id number] Visit Date: 12/27/2023  Subjective:    Chief Complaint  Patient presents with   Sweating    HPI No previous pcp GYN: Dr. Araceli  Acne On back, chronic. Advised to use topical acne cleanser and cream with salicylic acid and benzyl peroxide. Entered referral to dermatology  Female hirsutism On chin mostly Hx of PCOS per GYN We discussed use of COC and spironolactone. 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  Check Tsh, cortisol, uric acid, HIV, cbc, hgb A1c, random urine catecholamine Advised to quit marijuana use Advised to use certain Dri or secret clinical deodorant.  Adjustment disorder with anxious mood Chronic Currently managed by use of marijuana  She agreed to psychology referral Advised to quit marijuana use F/up in 3months     12/25/2023    9:24 AM 05/08/2023   10:10 AM  Depression screen PHQ 2/9  Decreased Interest 1 0  Down, Depressed, Hopeless 0 0  PHQ - 2 Score 1 0  Altered sleeping 2 2  Tired, decreased energy 2 2  Change in appetite 0 1  Feeling bad or failure about yourself  0 1  Trouble concentrating 0 0  Moving slowly or fidgety/restless 0 0  Suicidal thoughts 0 0  PHQ-9 Score 5 6  Difficult doing work/chores Not difficult at all        12/25/2023    9:24 AM 05/08/2023   10:11 AM  GAD 7 : Generalized Anxiety Score  Nervous, Anxious, on Edge 0 0  Control/stop worrying 0 1  Worry  too much - different things 1 1  Trouble relaxing 1 1  Restless 0 0  Easily annoyed or irritable 0 0  Afraid - awful might happen 0 0  Total GAD 7 Score 2 3  Anxiety Difficulty Not difficult at all    Reviewed medical, surgical, and social history today  Medications: No outpatient medications prior to visit.   No facility-administered medications prior to visit.   Reviewed past medical and social history.   ROS per HPI above      Objective:  BP 126/82 (BP Location: Left Arm, Patient Position: Sitting, Cuff Size: Normal)   Pulse 80   Temp 98.7 F (37.1 C) (Oral)   Ht 5' 4 (1.626 m)   Wt 193 lb 12.8 oz (87.9 kg)   LMP 10/15/2023 Comment: recent miscarriage  SpO2 98%   Breastfeeding Unknown   BMI 33.27 kg/m      Physical Exam Vitals and nursing note reviewed.  Neck:     Thyroid: No thyroid mass, thyromegaly or thyroid tenderness.  Cardiovascular:     Rate and Rhythm: Normal rate and regular rhythm.     Pulses: Normal pulses.     Heart sounds: Normal heart sounds.  Pulmonary:     Effort: Pulmonary effort is normal.     Breath sounds: Normal breath sounds.  Lymphadenopathy:     Cervical:  No cervical adenopathy.  Neurological:     Mental Status: She is alert and oriented to person, place, and time.  Psychiatric:        Behavior: Behavior normal.        Thought Content: Thought content normal.     Results for orders placed or performed in visit on 12/25/23  CBC  Result Value Ref Range   WBC 5.2 4.0 - 10.5 K/uL   RBC 4.97 3.87 - 5.11 Mil/uL   Platelets 201.0 150.0 - 400.0 K/uL   Hemoglobin 13.8 12.0 - 15.0 g/dL   HCT 56.6 63.9 - 53.9 %   MCV 87.2 78.0 - 100.0 fl   MCHC 31.9 30.0 - 36.0 g/dL   RDW 84.7 88.4 - 84.4 %  Comprehensive metabolic panel with GFR  Result Value Ref Range   Sodium 140 135 - 145 mEq/L   Potassium 4.2 3.5 - 5.1 mEq/L   Chloride 106 96 - 112 mEq/L   CO2 28 19 - 32 mEq/L   Glucose, Bld 87 70 - 99 mg/dL   BUN 17 6 - 23 mg/dL    Creatinine, Ser 9.13 0.40 - 1.20 mg/dL   Total Bilirubin 0.3 0.2 - 1.2 mg/dL   Alkaline Phosphatase 65 39 - 117 U/L   AST 13 0 - 37 U/L   ALT 8 0 - 35 U/L   Total Protein 7.4 6.0 - 8.3 g/dL   Albumin 4.6 3.5 - 5.2 g/dL   GFR 10.60 >39.99 mL/min   Calcium 9.2 8.4 - 10.5 mg/dL  Hemoglobin J8r  Result Value Ref Range   Hgb A1c MFr Bld 6.1 4.6 - 6.5 %  TSH  Result Value Ref Range   TSH 0.84 0.35 - 5.50 uIU/mL  HIV Antibody (routine testing w rflx)  Result Value Ref Range   HIV FINAL INTERPRETATION     HIV 1&2 Ab, 4th Generation NON-REACTIVE NON-REACTIVE  Uric acid  Result Value Ref Range   Uric Acid, Serum 4.5 2.4 - 7.0 mg/dL  Cortisol  Result Value Ref Range   Cortisol, Plasma 8.4 ug/dL      Assessment & Plan:    Problem List Items Addressed This Visit     Acne   On back, chronic. Advised to use topical acne cleanser and cream with salicylic acid and benzyl peroxide. Entered referral to dermatology      Relevant Orders   TSH (Completed)   Ambulatory referral to Dermatology   Adjustment disorder with anxious mood - Primary   Chronic Currently managed by use of marijuana  She agreed to psychology referral Advised to quit marijuana use F/up in 3months      Relevant Orders   Ambulatory referral to Psychology   Excessive sweating   Reports increased sweating and facial hair, chronic Reports excessive Sweating with mild warm/hot weather (head, palms, underarm, and groins) Current Use of anti perspirant (secret, sauve, dove, arm and hammer) with minimal improvement Reports hx of PCOS per GYN: has monthly cycle Marijuana use 1-2x/daily, onset at age 64 No tobacco use, previous use age 23 to 38, 2small cigars daily ALCOHOL: 1x/week, drinks.  Check Tsh, cortisol, uric acid, HIV, cbc, hgb A1c, random urine catecholamine Advised to quit marijuana use Advised to use certain Dri or secret clinical deodorant.      Female hirsutism   On chin mostly Hx of PCOS  per GYN We discussed use of COC and spironolactone. She declined both due to potential side effects  Relevant Orders   Hemoglobin A1c (Completed)   TSH (Completed)   Uric acid (Completed)   Cortisol (Completed)   Return in about 3 months (around 03/26/2024) for Obesity and PCOS.     Roselie Mood, NP

## 2023-12-25 NOTE — Patient Instructions (Addendum)
 Thank you for choosing [facility] Go to lab for blood draw Use certain dry or secret clinical antiperspirant. Use neutrogena acne shampoo and treatment cream on back Maintain Heart healthy diet and daily exercise.  Polycystic Ovary Syndrome  Polycystic ovarian syndrome (PCOS) is a common hormonal disorder among women of reproductive age. In most women with PCOS, small fluid-filled sacs (cysts) grow on the ovaries. PCOS can cause problems with menstrual periods and make it hard to get and stay pregnant. If this condition is not treated, it can lead to serious health problems, such as diabetes and heart disease. What are the causes? The cause of this condition is not known. It may be due to certain factors, such as: Irregular menstrual cycle. High levels of certain hormones. Problems with the hormone that helps to control blood sugar (insulin). Certain genes. What increases the risk? You are more likely to develop this condition if you: Have a family history of PCOS or type 2 diabetes. Are overweight, eat unhealthy foods, and are not active. These factors may cause problems with blood sugar control, which can contribute to PCOS or PCOS symptoms. What are the signs or symptoms? Symptoms of this condition include: Ovarian cysts and sometimes pelvic pain. Menstrual periods that are not regular or are too heavy. Inability to get or stay pregnant. Increased growth of hair on the face, chest, stomach, back, thumbs, thighs, or toes. Acne or oily skin. Acne may develop during adulthood, and it may not get better with treatment. Weight gain or obesity. Patches of thickened and dark brown or black skin on the neck, arms, breasts, or thighs. How is this diagnosed? This condition is diagnosed based on: Your medical history. A physical exam that includes a pelvic exam. Your health care provider may look for areas of increased hair growth on your skin. Tests, such as: An ultrasound to  check the ovaries for cysts and to view the lining of the uterus. Blood tests to check levels of sugar (glucose), female hormone (testosterone), and female hormones (estrogen and progesterone). How is this treated? There is no cure for this condition, but treatment can help to manage symptoms and prevent more health problems from developing. Treatment varies depending on your symptoms and if you want to have a baby or if you need birth control. Treatment may include: Making nutrition and lifestyle changes. Taking the progesterone hormone to start a menstrual period. Taking birth control pills to help you have regular menstrual periods. Taking medicines such as: Medicines to make you ovulate, if you want to get pregnant. Medicine to reduce extra hair growth. Having surgery in severe cases. This may involve making small holes in one or both of your ovaries. This decreases the amount of testosterone that your body makes. Follow these instructions at home: Take over-the-counter and prescription medicines only as told by your health care provider. Follow a healthy meal plan that includes lean proteins, complex carbohydrates, fresh fruits and vegetables, low-fat dairy products, healthy fats, and fiber. If you are overweight, lose weight as told by your health care provider. Your health care provider can determine how much weight loss is best for you and can help you lose weight safely. Keep all follow-up visits. This is important. Contact a health care provider if: Your symptoms do not get better with medicine. Your symptoms get worse or you develop new symptoms. Summary Polycystic ovarian syndrome (PCOS) is a common hormonal disorder among women of reproductive age. PCOS can cause problems with menstrual periods  and make it hard to get and stay pregnant. If this condition is not treated, it can lead to serious health problems, such as diabetes and heart disease. There is no cure for this condition,  but treatment can help to manage symptoms and prevent more health problems from developing. This information is not intended to replace advice given to you by your health care provider. Make sure you discuss any questions you have with your health care provider. Document Revised: 03/26/2023 Document Reviewed: 03/26/2023 Elsevier Patient Education  2024 [employer] Diet for Polycystic Ovary Syndrome Polycystic ovary syndrome (PCOS) is a common hormonal disorder that affects a woman's reproductive system. It can cause problems with menstrual periods and make it hard to get and stay pregnant. Changing what you eat can help your hormones reach normal levels, improve your health, and help you better manage PCOS. Following a balanced diet can help you lose weight and improve the way that your body uses the hormone insulin to control blood sugar. This may include: Eating low-fat (lean) proteins, complex carbohydrates, fresh fruits and vegetables, low-fat dairy products, healthy fats, and fiber. Cutting down on calories. Exercising regularly. What are tips for following this plan? Follow a balanced diet for meals and snacks. Eat breakfast, lunch, dinner, and one or two snacks every day. Include protein in each meal and snack. Choose whole grains instead of products that are made with refined flour. Eat a variety of foods. Exercise regularly as told by your health care provider. Aim to do at least 30 minutes of exercise on most days of the week. If you are overweight or obese: Pay attention to how many calories you eat. Cutting down on calories can help you lose weight. Work with your health care provider or a dietitian to figure out how many calories you need each day. What foods should I eat?  Fruits Include a variety of colors and types. All fruits are helpful for PCOS. Vegetables Include a variety of colors and types. All vegetables are helpful for PCOS. Grains Whole grains, such as whole  wheat. Whole-grain breads, crackers, cereals, and pasta. Unsweetened oatmeal. Bulgur, barley, quinoa, and brown rice. Tortillas made from corn or whole-wheat flour. Meats and other proteins Lean proteins, such as fish, chicken, beans, eggs, and tofu. Dairy Low-fat dairy products, such as skim milk, cheese sticks, and yogurt. Beverages Low-fat or fat-free drinks, such as water, low-fat milk, sugar-free drinks, and small amounts of 100% fruit juice. Seasonings and condiments Ketchup. Mustard. Barbecue sauce. Relish. Low-fat or fat-free mayonnaise. Fats and oils Olive oil or canola oil. Walnuts and almonds. The items listed above may not be a complete list of recommended foods and beverages. Contact a dietitian for more options. What foods should I avoid? Foods that are high in calories or fat, especially saturated or trans fats. Fried foods. Sweets. Products that are made from refined white flour, including white bread, pastries, white rice, and pasta. The items listed above may not be a complete list of foods and beverages to avoid. Contact a dietitian for more information. Summary PCOS is a hormonal imbalance that affects a woman's reproductive system. It can cause problems with menstrual periods and make it hard to get and stay pregnant. You can help to manage your PCOS by exercising regularly and eating a healthy, varied diet of vegetables, fruit, whole grains, lean protein, and low-fat dairy products. Changing what you eat can improve the way that your body uses insulin, help your hormones reach normal levels, and  help you lose weight. This information is not intended to replace advice given to you by your health care provider. Make sure you discuss any questions you have with your health care provider. Document Revised: 03/26/2023 Document Reviewed: 03/26/2023 Elsevier Patient Education  2024 [employer].

## 2023-12-27 ENCOUNTER — Encounter: Payer: Self-pay | Admitting: Nurse Practitioner

## 2023-12-27 DIAGNOSIS — F4322 Adjustment disorder with anxiety: Secondary | ICD-10-CM | POA: Insufficient documentation

## 2023-12-27 NOTE — Assessment & Plan Note (Signed)
 On chin mostly Hx of PCOS per GYN We discussed use of COC and spironolactone. She declined both due to potential side effects

## 2023-12-27 NOTE — Assessment & Plan Note (Signed)
 Chronic Currently managed by use of marijuana  She agreed to psychology referral Advised to quit marijuana use F/up in 3months

## 2023-12-27 NOTE — Assessment & Plan Note (Signed)
 On back, chronic. Advised to use topical acne cleanser and cream with salicylic acid and benzyl peroxide. Entered referral to dermatology

## 2023-12-27 NOTE — Assessment & Plan Note (Signed)
 Reports increased sweating and facial hair, chronic Reports excessive Sweating with mild warm/hot weather (head, palms, underarm, and groins) Current Use of anti perspirant (secret, sauve, dove, arm and hammer) with minimal improvement Reports hx of PCOS per GYN: has monthly cycle Marijuana use 1-2x/daily, onset at age 33 No tobacco use, previous use age 74 to 58, 2small cigars daily ALCOHOL: 1x/week, drinks.  Check Tsh, cortisol, uric acid, HIV, cbc, hgb A1c, random urine catecholamine Advised to quit marijuana use Advised to use certain Dri or secret clinical deodorant.

## 2023-12-29 LAB — CATECHOLAMINES, FRACT, RANDOM URINE
Calc Total (E+NE) Random: 29 ug/g{creat} (ref 9–74)
Creatinine, Random U: 147 mg/dL (ref 20–275)
Dopamine, Rand Ur: 191 ug/g{creat} (ref 40–390)
Norepinephrine, Random U: 29 ug/g{creat} (ref 7–65)

## 2023-12-29 LAB — HIV ANTIBODY (ROUTINE TESTING W REFLEX): HIV 1&2 Ab, 4th Generation: NONREACTIVE

## 2024-01-28 ENCOUNTER — Encounter: Payer: Self-pay | Admitting: Obstetrics and Gynecology

## 2024-02-12 ENCOUNTER — Encounter: Payer: Self-pay | Admitting: Obstetrics and Gynecology

## 2024-02-12 ENCOUNTER — Ambulatory Visit (INDEPENDENT_AMBULATORY_CARE_PROVIDER_SITE_OTHER): Payer: PRIVATE HEALTH INSURANCE | Admitting: Obstetrics and Gynecology

## 2024-02-12 ENCOUNTER — Other Ambulatory Visit: Payer: Self-pay

## 2024-02-12 VITALS — BP 117/80 | HR 85 | Wt 195.0 lb

## 2024-02-12 DIAGNOSIS — Z1331 Encounter for screening for depression: Secondary | ICD-10-CM | POA: Diagnosis not present

## 2024-02-12 DIAGNOSIS — Z3009 Encounter for other general counseling and advice on contraception: Secondary | ICD-10-CM

## 2024-02-12 MED ORDER — DROSPIRENONE-ETHINYL ESTRADIOL 3-0.02 MG PO TABS
1.0000 | ORAL_TABLET | Freq: Every day | ORAL | 11 refills | Status: AC
Start: 2024-02-12 — End: ?

## 2024-02-12 NOTE — Progress Notes (Signed)
    GYNECOLOGY VISIT  Patient name: April Fischer MRN [id number]  Date of birth: May 03, 1991 Chief Complaint:   contraceptives  History:  Would like to discuss contraceptives. 48 hours of OCPs in high school. Has migraines without aura. No CHC contraindications. Reports hair on chin/face.   The following portions of the patient's history were reviewed and updated as appropriate: allergies, current medications, past family history, past medical history, past social history, past surgical history and problem list.   Health Maintenance:   Last pap     Component Value Date/Time   DIAGPAP  05/08/2023 0927    - Negative for intraepithelial lesion or malignancy (NILM)   HPVHIGH Negative 05/08/2023 0927   ADEQPAP  05/08/2023 0927    Satisfactory but limited for evaluation with scant cellularity;   ADEQPAP transformation zone component present. 05/08/2023 9072    Health Maintenance  Topic Date Due   Hepatitis B Vaccine (1 of 3 - 19+ 3-dose series) Never done   HPV Vaccine (1 - 3-dose SCDM series) Never done   Flu Shot  12/21/2023   COVID-19 Vaccine (1 - 2024-25 season) Never done   DTaP/Tdap/Td vaccine (2 - Td or Tdap) 07/24/2025   Pap with HPV screening  05/07/2028   Hepatitis C Screening  Completed   HIV Screening  Completed   Pneumococcal Vaccine  Aged Out   Meningitis B Vaccine  Aged Out      Review of Systems:  Pertinent items are noted in HPI. Comprehensive review of systems was otherwise negative.   Objective:  Physical Exam BP 117/80   Pulse 85   Wt 195 lb (88.5 kg)   LMP 10/07/2023   BMI 33.47 kg/m    Physical Exam Vitals and nursing note reviewed.  Constitutional:      Appearance: Normal appearance.  HENT:     Head: Normocephalic and atraumatic.  Pulmonary:     Effort: Pulmonary effort is normal.  Skin:    General: Skin is warm and dry.  Neurological:     General: No focal deficit present.     Mental Status: She is alert.  Psychiatric:        Mood and  Affect: Mood normal.        Behavior: Behavior normal.        Thought Content: Thought content normal.        Judgment: Judgment normal.       Assessment & Plan:  Reviewed all forms of birth control options available including abstinence; over the counter/barrier methods; hormonal contraceptive medication including pill, patch, ring, injection,contraceptive implant; hormonal and nonhormonal IUDs; permanent sterilization options including vasectomy and the various tubal sterilization modalities. Risks and benefits reviewed.  Questions were answered.  Information was given to patient to review.   No CHC contraindications. Will start with CHCs - Yaz for it's anti-androgenic effects. Follow up in 3 months.    Carter Quarry, MD Minimally Invasive Gynecologic Surgery Center for [facility], [facility]

## 2024-03-28 ENCOUNTER — Ambulatory Visit: Payer: PRIVATE HEALTH INSURANCE | Admitting: Nurse Practitioner

## 2024-08-06 ENCOUNTER — Ambulatory Visit: Payer: PRIVATE HEALTH INSURANCE | Admitting: Physician Assistant
